# Patient Record
Sex: Male | Born: 1990
Health system: Southern US, Community
[De-identification: ages and names within clinical notes are randomized; demographics above are authoritative.]

## PROBLEM LIST (undated history)

## (undated) DIAGNOSIS — J45909 Unspecified asthma, uncomplicated: Secondary | ICD-10-CM

## (undated) DIAGNOSIS — S21139A Puncture wound without foreign body of unspecified front wall of thorax without penetration into thoracic cavity, initial encounter: Secondary | ICD-10-CM

## (undated) DIAGNOSIS — I213 ST elevation (STEMI) myocardial infarction of unspecified site: Secondary | ICD-10-CM

## (undated) DIAGNOSIS — W3400XA Accidental discharge from unspecified firearms or gun, initial encounter: Secondary | ICD-10-CM

## (undated) DIAGNOSIS — F17210 Nicotine dependence, cigarettes, uncomplicated: Secondary | ICD-10-CM

## (undated) DIAGNOSIS — I255 Ischemic cardiomyopathy: Secondary | ICD-10-CM

## (undated) DIAGNOSIS — I2542 Coronary artery dissection: Secondary | ICD-10-CM

## (undated) DIAGNOSIS — I513 Intracardiac thrombosis, not elsewhere classified: Secondary | ICD-10-CM

## (undated) DIAGNOSIS — I312 Hemopericardium, not elsewhere classified: Secondary | ICD-10-CM

## (undated) DIAGNOSIS — I5042 Chronic combined systolic (congestive) and diastolic (congestive) heart failure: Secondary | ICD-10-CM

## (undated) DIAGNOSIS — I2510 Atherosclerotic heart disease of native coronary artery without angina pectoris: Secondary | ICD-10-CM

## (undated) HISTORY — DX: Ischemic cardiomyopathy: I25.5

## (undated) HISTORY — DX: Atherosclerotic heart disease of native coronary artery without angina pectoris: I25.10

## (undated) HISTORY — DX: Hemopericardium, not elsewhere classified: I31.2

## (undated) HISTORY — DX: Chronic combined systolic (congestive) and diastolic (congestive) heart failure: I50.42

## (undated) HISTORY — DX: Intracardiac thrombosis, not elsewhere classified: I51.3

---
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
## 2013-09-22 ENCOUNTER — Emergency Department (HOSPITAL_COMMUNITY)
Admission: EM | Admit: 2013-09-22 | Discharge: 2013-09-22 | Disposition: A | Discharge status: Home / Self Care | Payer: Self-pay | Attending: Emergency Medicine | Admitting: Emergency Medicine

## 2013-09-22 ENCOUNTER — Encounter (HOSPITAL_COMMUNITY): Payer: Self-pay | Admitting: Emergency Medicine

## 2013-09-22 DIAGNOSIS — R197 Diarrhea, unspecified: Secondary | ICD-10-CM | POA: Insufficient documentation

## 2013-09-22 DIAGNOSIS — R1084 Generalized abdominal pain: Secondary | ICD-10-CM | POA: Insufficient documentation

## 2013-09-22 DIAGNOSIS — F17200 Nicotine dependence, unspecified, uncomplicated: Secondary | ICD-10-CM | POA: Insufficient documentation

## 2013-09-22 LAB — CBC WITH DIFFERENTIAL/PLATELET
Basophils Relative: 0 % (ref 0–1)
Eosinophils Absolute: 0 10*3/uL (ref 0.0–0.7)
Eosinophils Relative: 0 % (ref 0–5)
HCT: 48.4 % (ref 39.0–52.0)
Hemoglobin: 17.6 g/dL — ABNORMAL HIGH (ref 13.0–17.0)
Lymphocytes Relative: 6 % — ABNORMAL LOW (ref 12–46)
Lymphs Abs: 0.8 10*3/uL (ref 0.7–4.0)
MCH: 32.6 pg (ref 26.0–34.0)
MCHC: 36.4 g/dL — ABNORMAL HIGH (ref 30.0–36.0)
MCV: 89.6 fL (ref 78.0–100.0)
Monocytes Absolute: 0.7 10*3/uL (ref 0.1–1.0)
Monocytes Relative: 5 % (ref 3–12)
Neutro Abs: 12.3 10*3/uL — ABNORMAL HIGH (ref 1.7–7.7)
Neutrophils Relative %: 89 % — ABNORMAL HIGH (ref 43–77)
RBC: 5.4 MIL/uL (ref 4.22–5.81)
WBC: 13.8 10*3/uL — ABNORMAL HIGH (ref 4.0–10.5)

## 2013-09-22 LAB — COMPREHENSIVE METABOLIC PANEL
AST: 21 U/L (ref 0–37)
Albumin: 4.5 g/dL (ref 3.5–5.2)
BUN: 9 mg/dL (ref 6–23)
Calcium: 9.7 mg/dL (ref 8.4–10.5)
Chloride: 94 mEq/L — ABNORMAL LOW (ref 96–112)
Creatinine, Ser: 1.42 mg/dL — ABNORMAL HIGH (ref 0.50–1.35)
GFR calc Af Amer: 80 mL/min — ABNORMAL LOW (ref >90)
Glucose, Bld: 102 mg/dL — ABNORMAL HIGH (ref 70–99)
Total Protein: 8.8 g/dL — ABNORMAL HIGH (ref 6.0–8.3)

## 2013-09-22 MED ORDER — DICYCLOMINE HCL 20 MG PO TABS: 20.0000 mg | ORAL_TABLET | Freq: Two times a day (BID) | ORAL | Status: DC

## 2013-09-22 MED ORDER — DICYCLOMINE HCL 10 MG PO CAPS
10.0000 mg | ORAL_CAPSULE | Freq: Once | ORAL | Status: AC
  Administered 2013-09-22: 10 mg via ORAL
  Filled 2013-09-22: qty 1

## 2013-09-22 NOTE — ED Provider Notes (Signed)
CSN: [id number]     Arrival date & time 09/22/13  1130 History   First MD Initiated Contact with Patient 09/22/13 1146     Chief Complaint  Patient presents with  . Diarrhea   (Consider location/radiation/quality/duration/timing/severity/associated sxs/prior Treatment) Patient is a 22 y.o. male presenting with diarrhea. The history is provided by the patient and medical records.  Diarrhea  This is a 22 year old male with no significant past medical history presenting to the ED for diarrhea x3 days. Patient states he has had approximately 10-12 episodes of watery, nonbloody diarrhea since Thursday evening. States he is not really having abdominal "pain" but he does have a generalized discomfort and cramping from having so many bowel movements.  No associated nausea, vomiting, or urinary sx.  Patient states his last solid food intake was a cheeseburger and french fries on Thursday evening.  He notes a subjective fever and chills at home, afebrile on arrival.  Patient states he has been able to tolerate PO gingerale.  No recent sick contacts or recent travel.  No meds taken PTA.  No hx of IBS or crohn's.  History reviewed. No pertinent past medical history. History reviewed. No pertinent past surgical history. No family history on file. History  Substance Use Topics  . Smoking status: Current Every Day Smoker  . Smokeless tobacco: Not on file  . Alcohol Use: Yes    Review of Systems  Gastrointestinal: Positive for diarrhea.  All other systems reviewed and are negative.    Allergies  Review of patient's allergies indicates no known allergies.  Home Medications   Current Outpatient Rx  Name  Route  Sig  Dispense  Refill  . acetaminophen (TYLENOL) 500 MG tablet   Oral   Take 1,000 mg by mouth every 6 (six) hours as needed.          BP 145/96  Pulse 112  Temp(Src) 98.4 F (36.9 C) (Oral)  Resp 16  Wt 197 lb 12.8 oz (89.721 kg)  SpO2 96%  Physical Exam  Nursing note and  vitals reviewed. Constitutional: He is oriented to person, place, and time. He appears well-developed and well-nourished. No distress.  Lying in bed comfortably, texting on cellphone, NAD  HENT:  Head: Normocephalic and atraumatic.  Mouth/Throat: Uvula is midline, oropharynx is clear and moist and mucous membranes are normal.  Eyes: Conjunctivae and EOM are normal. Pupils are equal, round, and reactive to light.  Neck: Normal range of motion. Neck supple.  Cardiovascular: Normal rate, regular rhythm and normal heart sounds.   Pulmonary/Chest: Effort normal and breath sounds normal. No respiratory distress. He has no wheezes.  Abdominal: Soft. Bowel sounds are normal. There is no tenderness. There is no guarding.  Abdomen soft, non-distended, no focal TTP  Musculoskeletal: Normal range of motion.  Neurological: He is alert and oriented to person, place, and time.  Skin: Skin is warm and dry. He is not diaphoretic.  Psychiatric: He has a normal mood and affect.    ED Course  Procedures (including critical care time) Labs Review Labs Reviewed  CBC WITH DIFFERENTIAL - Abnormal; Notable for the following:    WBC 13.8 (*)    Hemoglobin 17.6 (*)    MCHC 36.4 (*)    Neutrophils Relative % 89 (*)    Neutro Abs 12.3 (*)    Lymphocytes Relative 6 (*)    All other components within normal limits  COMPREHENSIVE METABOLIC PANEL - Abnormal; Notable for the following:    Sodium 134 (*)  Chloride 94 (*)    Glucose, Bld 102 (*)    Creatinine, Ser 1.42 (*)    Total Protein 8.8 (*)    GFR calc non Af Amer 69 (*)    GFR calc Af Amer 80 (*)    All other components within normal limits   Imaging Review No results found.  EKG Interpretation   None       MDM   1. Diarrhea    Labs as above, mild leukocytosis.  Pt has tolerated PO in the ED without difficulty.  No episodes of diarrhea in the ED.  At this time i doubt acute/surgical abdomen including, but not limited to, diverticulitis,  SBO, abdominal abscess-- likely gastroenteritis/colitis.  Pt afebrile, non-toxic appearing, NAD, VS stable- ok for discharge.  Instructed to try immodium to help with diarrhea.  Rx bentyl.  FU with [facility] if no improvement in the next few days.  Discussed plan with pt, he agreed.  Return precautions advised.  Garlon Hatchet, PA-C 09/22/13 [phone]

## 2013-09-22 NOTE — Discharge Instructions (Signed)
Take the prescribed medication as directed.  Also advise taking some over the counter imodium to help with diarrhea. Follow-up with the [facility] if no improvement of symptoms within the next few days. Return to the ED for new or worsening symptoms.

## 2013-09-22 NOTE — ED Notes (Signed)
Pt. Stated, i've been having diarrhea since Thursday and having a fever.

## 2013-09-23 NOTE — ED Provider Notes (Signed)
Medical screening examination/treatment/procedure(s) were conducted as a shared visit with non-physician practitioner(s) and myself.  I personally evaluated the patient during the encounter.  EKG Interpretation   None      No blood or pus in stool. Patient is hemodynamically stable. Not dehydrated  Donnetta Hutching, MD 09/23/13 [phone]

## 2015-06-05 ENCOUNTER — Encounter (HOSPITAL_COMMUNITY): Payer: Self-pay | Admitting: Emergency Medicine

## 2015-06-05 ENCOUNTER — Inpatient Hospital Stay (HOSPITAL_COMMUNITY)
Admission: EM | Admit: 2015-06-05 | Discharge: 2015-06-11 | DRG: 907 | Disposition: A | Discharge status: Home Health | Payer: No Typology Code available for payment source | Attending: General Surgery | Admitting: General Surgery

## 2015-06-05 ENCOUNTER — Emergency Department (HOSPITAL_COMMUNITY): Payer: No Typology Code available for payment source

## 2015-06-05 DIAGNOSIS — S21139A Puncture wound without foreign body of unspecified front wall of thorax without penetration into thoracic cavity, initial encounter: Secondary | ICD-10-CM

## 2015-06-05 DIAGNOSIS — S21102A Unspecified open wound of left front wall of thorax without penetration into thoracic cavity, initial encounter: Secondary | ICD-10-CM | POA: Diagnosis present

## 2015-06-05 DIAGNOSIS — I10 Essential (primary) hypertension: Secondary | ICD-10-CM | POA: Diagnosis present

## 2015-06-05 DIAGNOSIS — E876 Hypokalemia: Secondary | ICD-10-CM | POA: Diagnosis not present

## 2015-06-05 DIAGNOSIS — R000 Tachycardia, unspecified: Secondary | ICD-10-CM | POA: Diagnosis present

## 2015-06-05 DIAGNOSIS — I240 Acute coronary thrombosis not resulting in myocardial infarction: Secondary | ICD-10-CM | POA: Diagnosis present

## 2015-06-05 DIAGNOSIS — S31109A Unspecified open wound of abdominal wall, unspecified quadrant without penetration into peritoneal cavity, initial encounter: Secondary | ICD-10-CM | POA: Diagnosis present

## 2015-06-05 DIAGNOSIS — I314 Cardiac tamponade: Secondary | ICD-10-CM

## 2015-06-05 DIAGNOSIS — W3400XA Accidental discharge from unspecified firearms or gun, initial encounter: Secondary | ICD-10-CM

## 2015-06-05 DIAGNOSIS — S2609XA Other injury of heart with hemopericardium, initial encounter: Secondary | ICD-10-CM | POA: Diagnosis present

## 2015-06-05 DIAGNOSIS — I312 Hemopericardium, not elsewhere classified: Secondary | ICD-10-CM

## 2015-06-05 DIAGNOSIS — W3301XA Accidental discharge of shotgun, initial encounter: Secondary | ICD-10-CM

## 2015-06-05 DIAGNOSIS — E1165 Type 2 diabetes mellitus with hyperglycemia: Secondary | ICD-10-CM | POA: Diagnosis present

## 2015-06-05 DIAGNOSIS — Z9689 Presence of other specified functional implants: Secondary | ICD-10-CM

## 2015-06-05 DIAGNOSIS — F419 Anxiety disorder, unspecified: Secondary | ICD-10-CM | POA: Diagnosis present

## 2015-06-05 DIAGNOSIS — S21302A Unspecified open wound of left front wall of thorax with penetration into thoracic cavity, initial encounter: Principal | ICD-10-CM | POA: Diagnosis present

## 2015-06-05 DIAGNOSIS — T794XXA Traumatic shock, initial encounter: Secondary | ICD-10-CM | POA: Diagnosis present

## 2015-06-05 DIAGNOSIS — I959 Hypotension, unspecified: Secondary | ICD-10-CM | POA: Diagnosis present

## 2015-06-05 DIAGNOSIS — S21132A Puncture wound without foreign body of left front wall of thorax without penetration into thoracic cavity, initial encounter: Secondary | ICD-10-CM

## 2015-06-05 DIAGNOSIS — R0600 Dyspnea, unspecified: Secondary | ICD-10-CM | POA: Diagnosis not present

## 2015-06-05 DIAGNOSIS — R410 Disorientation, unspecified: Secondary | ICD-10-CM | POA: Diagnosis present

## 2015-06-05 DIAGNOSIS — D62 Acute posthemorrhagic anemia: Secondary | ICD-10-CM | POA: Diagnosis not present

## 2015-06-05 HISTORY — DX: Nicotine dependence, cigarettes, uncomplicated: F17.210

## 2015-06-05 LAB — CBC
HCT: 41.9 % (ref 39.0–52.0)
Hemoglobin: 14.7 g/dL (ref 13.0–17.0)
MCH: 32.1 pg (ref 26.0–34.0)
MCHC: 35.1 g/dL (ref 30.0–36.0)
MCV: 91.5 fL (ref 78.0–100.0)
Platelets: 288 10*3/uL (ref 150–400)
RBC: 4.58 MIL/uL (ref 4.22–5.81)
RDW: 12.6 % (ref 11.5–15.5)
WBC: 7 10*3/uL (ref 4.0–10.5)

## 2015-06-05 MED ORDER — SODIUM CHLORIDE 0.9 % IV SOLN
INTRAVENOUS | Status: AC | PRN
  Administered 2015-06-05 – 2015-06-06 (×2): 1000 mL via INTRAVENOUS

## 2015-06-05 MED ORDER — ROCURONIUM BROMIDE 50 MG/5ML IV SOLN
INTRAVENOUS | Status: AC | PRN
  Administered 2015-06-05: 100 mg via INTRAVENOUS

## 2015-06-05 MED ORDER — IOHEXOL 300 MG/ML  SOLN
100.0000 mL | Freq: Once | INTRAMUSCULAR | Status: AC | PRN
  Administered 2015-06-05: 100 mL via INTRAVENOUS

## 2015-06-05 MED ORDER — ETOMIDATE 2 MG/ML IV SOLN
INTRAVENOUS | Status: AC | PRN
  Administered 2015-06-05: 20 mg via INTRAVENOUS

## 2015-06-05 NOTE — ED Notes (Signed)
Pt in CT.

## 2015-06-05 NOTE — ED Notes (Signed)
Preparing to intubate.  

## 2015-06-05 NOTE — ED Notes (Signed)
Pt intubated
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
## 2015-06-06 ENCOUNTER — Emergency Department (HOSPITAL_COMMUNITY): Payer: No Typology Code available for payment source

## 2015-06-06 ENCOUNTER — Emergency Department (HOSPITAL_COMMUNITY): Payer: No Typology Code available for payment source | Admitting: Anesthesiology

## 2015-06-06 ENCOUNTER — Encounter (HOSPITAL_COMMUNITY): Payer: Self-pay | Admitting: Radiology

## 2015-06-06 ENCOUNTER — Other Ambulatory Visit: Payer: Self-pay

## 2015-06-06 ENCOUNTER — Inpatient Hospital Stay (HOSPITAL_COMMUNITY): Payer: No Typology Code available for payment source

## 2015-06-06 ENCOUNTER — Encounter (HOSPITAL_COMMUNITY): Admission: EM | Disposition: A | Discharge status: Home Health | Payer: Self-pay | Source: Home / Self Care

## 2015-06-06 DIAGNOSIS — S31109A Unspecified open wound of abdominal wall, unspecified quadrant without penetration into peritoneal cavity, initial encounter: Secondary | ICD-10-CM | POA: Diagnosis present

## 2015-06-06 DIAGNOSIS — E876 Hypokalemia: Secondary | ICD-10-CM | POA: Diagnosis not present

## 2015-06-06 DIAGNOSIS — R000 Tachycardia, unspecified: Secondary | ICD-10-CM | POA: Diagnosis present

## 2015-06-06 DIAGNOSIS — I314 Cardiac tamponade: Secondary | ICD-10-CM | POA: Diagnosis present

## 2015-06-06 DIAGNOSIS — I10 Essential (primary) hypertension: Secondary | ICD-10-CM | POA: Diagnosis present

## 2015-06-06 DIAGNOSIS — S21102A Unspecified open wound of left front wall of thorax without penetration into thoracic cavity, initial encounter: Secondary | ICD-10-CM | POA: Diagnosis present

## 2015-06-06 DIAGNOSIS — W3400XA Accidental discharge from unspecified firearms or gun, initial encounter: Secondary | ICD-10-CM

## 2015-06-06 DIAGNOSIS — E1165 Type 2 diabetes mellitus with hyperglycemia: Secondary | ICD-10-CM | POA: Diagnosis present

## 2015-06-06 DIAGNOSIS — R410 Disorientation, unspecified: Secondary | ICD-10-CM | POA: Diagnosis present

## 2015-06-06 DIAGNOSIS — I959 Hypotension, unspecified: Secondary | ICD-10-CM | POA: Diagnosis present

## 2015-06-06 DIAGNOSIS — W3301XA Accidental discharge of shotgun, initial encounter: Secondary | ICD-10-CM | POA: Diagnosis not present

## 2015-06-06 DIAGNOSIS — S21302A Unspecified open wound of left front wall of thorax with penetration into thoracic cavity, initial encounter: Secondary | ICD-10-CM | POA: Diagnosis present

## 2015-06-06 DIAGNOSIS — F419 Anxiety disorder, unspecified: Secondary | ICD-10-CM | POA: Diagnosis present

## 2015-06-06 DIAGNOSIS — S21139A Puncture wound without foreign body of unspecified front wall of thorax without penetration into thoracic cavity, initial encounter: Secondary | ICD-10-CM

## 2015-06-06 DIAGNOSIS — I240 Acute coronary thrombosis not resulting in myocardial infarction: Secondary | ICD-10-CM | POA: Diagnosis present

## 2015-06-06 DIAGNOSIS — R0600 Dyspnea, unspecified: Secondary | ICD-10-CM | POA: Diagnosis present

## 2015-06-06 DIAGNOSIS — T794XXA Traumatic shock, initial encounter: Secondary | ICD-10-CM | POA: Diagnosis present

## 2015-06-06 DIAGNOSIS — S2609XA Other injury of heart with hemopericardium, initial encounter: Secondary | ICD-10-CM | POA: Diagnosis present

## 2015-06-06 DIAGNOSIS — D62 Acute posthemorrhagic anemia: Secondary | ICD-10-CM | POA: Diagnosis not present

## 2015-06-06 HISTORY — PX: PERICARDIAL FLUID DRAINAGE: SHX5100

## 2015-06-06 HISTORY — PX: CORONARY ARTERY BYPASS GRAFT: SHX141

## 2015-06-06 HISTORY — DX: Puncture wound without foreign body of unspecified front wall of thorax without penetration into thoracic cavity, initial encounter: S21.139A

## 2015-06-06 LAB — PREPARE FRESH FROZEN PLASMA
UNIT DIVISION: 0
Unit division: 0

## 2015-06-06 LAB — I-STAT CHEM 8, ED
BUN: 14 mg/dL (ref 6–20)
CALCIUM ION: 1.11 mmol/L — AB (ref 1.12–1.23)
Chloride: 108 mmol/L (ref 101–111)
Creatinine, Ser: 1.6 mg/dL — ABNORMAL HIGH (ref 0.61–1.24)
Glucose, Bld: 149 mg/dL — ABNORMAL HIGH (ref 65–99)
HCT: 46 % (ref 39.0–52.0)
HEMOGLOBIN: 15.6 g/dL (ref 13.0–17.0)
Potassium: 2.9 mmol/L — ABNORMAL LOW (ref 3.5–5.1)
Sodium: 146 mmol/L — ABNORMAL HIGH (ref 135–145)
TCO2: 19 mmol/L (ref 0–100)

## 2015-06-06 LAB — APTT: APTT: 28 s (ref 24–37)

## 2015-06-06 LAB — POCT I-STAT, CHEM 8
BUN: 10 mg/dL (ref 6–20)
Calcium, Ion: 1 mmol/L — ABNORMAL LOW (ref 1.12–1.23)
Chloride: 112 mmol/L — ABNORMAL HIGH (ref 101–111)
Creatinine, Ser: 1.3 mg/dL — ABNORMAL HIGH (ref 0.61–1.24)
Glucose, Bld: 195 mg/dL — ABNORMAL HIGH (ref 65–99)
HCT: 33 % — ABNORMAL LOW (ref 39.0–52.0)
Hemoglobin: 11.2 g/dL — ABNORMAL LOW (ref 13.0–17.0)
Potassium: 3.9 mmol/L (ref 3.5–5.1)
Sodium: 143 mmol/L (ref 135–145)
TCO2: 14 mmol/L (ref 0–100)

## 2015-06-06 LAB — COMPREHENSIVE METABOLIC PANEL
ALT: 32 U/L (ref 17–63)
ANION GAP: 15 (ref 5–15)
AST: 55 U/L — ABNORMAL HIGH (ref 15–41)
Albumin: 4.4 g/dL (ref 3.5–5.0)
Alkaline Phosphatase: 87 U/L (ref 38–126)
BILIRUBIN TOTAL: 0.5 mg/dL (ref 0.3–1.2)
BUN: 12 mg/dL (ref 6–20)
CALCIUM: 9 mg/dL (ref 8.9–10.3)
CHLORIDE: 110 mmol/L (ref 101–111)
CO2: 20 mmol/L — AB (ref 22–32)
CREATININE: 1.48 mg/dL — AB (ref 0.61–1.24)
GFR calc non Af Amer: >60 mL/min (ref >60)
GLUCOSE: 153 mg/dL — AB (ref 65–99)
Potassium: 2.9 mmol/L — ABNORMAL LOW (ref 3.5–5.1)
Sodium: 145 mmol/L (ref 135–145)
TOTAL PROTEIN: 7 g/dL (ref 6.5–8.1)

## 2015-06-06 LAB — POCT I-STAT 3, ART BLOOD GAS (G3+)
ACID-BASE DEFICIT: 7 mmol/L — AB (ref 0.0–2.0)
ACID-BASE DEFICIT: 3 mmol/L — AB (ref 0.0–2.0)
ACID-BASE DEFICIT: 3 mmol/L — AB (ref 0.0–2.0)
Acid-base deficit: 11 mmol/L — ABNORMAL HIGH (ref 0.0–2.0)
Acid-base deficit: 5 mmol/L — ABNORMAL HIGH (ref 0.0–2.0)
BICARBONATE: 20.9 meq/L (ref 20.0–24.0)
BICARBONATE: 20.8 meq/L (ref 20.0–24.0)
BICARBONATE: 23.3 meq/L (ref 20.0–24.0)
Bicarbonate: 13.8 mEq/L — ABNORMAL LOW (ref 20.0–24.0)
Bicarbonate: 22.9 mEq/L (ref 20.0–24.0)
O2 SAT: 92 %
O2 Saturation: 100 %
O2 Saturation: 93 %
O2 Saturation: 99 %
O2 Saturation: 93 %
PCO2 ART: 46.9 mmHg — AB (ref 35.0–45.0)
PCO2 ART: 43.8 mmHg (ref 35.0–45.0)
PH ART: 7.334 — AB (ref 7.350–7.450)
Patient temperature: 97.1
Patient temperature: 98.5
TCO2: 15 mmol/L (ref 0–100)
TCO2: 22 mmol/L (ref 0–100)
TCO2: 22 mmol/L (ref 0–100)
TCO2: 24 mmol/L (ref 0–100)
TCO2: 25 mmol/L (ref 0–100)
pCO2 arterial: 28.1 mmHg — ABNORMAL LOW (ref 35.0–45.0)
pCO2 arterial: 40.7 mmHg (ref 35.0–45.0)
pCO2 arterial: 41 mmHg (ref 35.0–45.0)
pH, Arterial: 7.3 — ABNORMAL LOW (ref 7.350–7.450)
pH, Arterial: 7.255 — ABNORMAL LOW (ref 7.350–7.450)
pH, Arterial: 7.314 — ABNORMAL LOW (ref 7.350–7.450)
pH, Arterial: 7.35 (ref 7.350–7.450)
pO2, Arterial: 294 mmHg — ABNORMAL HIGH (ref 80.0–100.0)
pO2, Arterial: 77 mmHg — ABNORMAL LOW (ref 80.0–100.0)
pO2, Arterial: 134 mmHg — ABNORMAL HIGH (ref 80.0–100.0)
pO2, Arterial: 65 mmHg — ABNORMAL LOW (ref 80.0–100.0)
pO2, Arterial: 73 mmHg — ABNORMAL LOW (ref 80.0–100.0)

## 2015-06-06 LAB — POCT I-STAT 7, (LYTES, BLD GAS, ICA,H+H)
Acid-base deficit: 12 mmol/L — ABNORMAL HIGH (ref 0.0–2.0)
Bicarbonate: 15.7 mEq/L — ABNORMAL LOW (ref 20.0–24.0)
Calcium, Ion: 1 mmol/L — ABNORMAL LOW (ref 1.12–1.23)
HCT: 41 % (ref 39.0–52.0)
Hemoglobin: 13.9 g/dL (ref 13.0–17.0)
O2 Saturation: 100 %
Patient temperature: 35.6
Potassium: 3.8 mmol/L (ref 3.5–5.1)
Sodium: 144 mmol/L (ref 135–145)
TCO2: 17 mmol/L (ref 0–100)
pCO2 arterial: 37.9 mmHg (ref 35.0–45.0)
pH, Arterial: 7.217 — ABNORMAL LOW (ref 7.350–7.450)
pO2, Arterial: 436 mmHg — ABNORMAL HIGH (ref 80.0–100.0)

## 2015-06-06 LAB — ETHANOL: ALCOHOL ETHYL (B): 292 mg/dL — AB (ref <5)

## 2015-06-06 LAB — BASIC METABOLIC PANEL
Anion gap: 10 (ref 5–15)
BUN: 10 mg/dL (ref 6–20)
CALCIUM: 6.9 mg/dL — AB (ref 8.9–10.3)
CO2: 20 mmol/L — ABNORMAL LOW (ref 22–32)
Chloride: 114 mmol/L — ABNORMAL HIGH (ref 101–111)
Creatinine, Ser: 1.05 mg/dL (ref 0.61–1.24)
GFR calc Af Amer: >60 mL/min (ref >60)
GFR calc non Af Amer: >60 mL/min (ref >60)
Glucose, Bld: 137 mg/dL — ABNORMAL HIGH (ref 65–99)
Potassium: 4.1 mmol/L (ref 3.5–5.1)
SODIUM: 144 mmol/L (ref 135–145)

## 2015-06-06 LAB — POCT I-STAT 4, (NA,K, GLUC, HGB,HCT)
Glucose, Bld: 141 mg/dL — ABNORMAL HIGH (ref 65–99)
HCT: 46 % (ref 39.0–52.0)
Hemoglobin: 15.6 g/dL (ref 13.0–17.0)
Potassium: 4.4 mmol/L (ref 3.5–5.1)
SODIUM: 147 mmol/L — AB (ref 135–145)

## 2015-06-06 LAB — CBC
HCT: 44.9 % (ref 39.0–52.0)
Hemoglobin: 15.5 g/dL (ref 13.0–17.0)
MCH: 31.4 pg (ref 26.0–34.0)
MCHC: 34.5 g/dL (ref 30.0–36.0)
MCV: 90.9 fL (ref 78.0–100.0)
Platelets: 186 10*3/uL (ref 150–400)
RBC: 4.94 MIL/uL (ref 4.22–5.81)
RDW: 13.3 % (ref 11.5–15.5)
WBC: 22.1 10*3/uL — ABNORMAL HIGH (ref 4.0–10.5)

## 2015-06-06 LAB — GLUCOSE, CAPILLARY
GLUCOSE-CAPILLARY: 144 mg/dL — AB (ref 65–99)
Glucose-Capillary: 118 mg/dL — ABNORMAL HIGH (ref 65–99)
Glucose-Capillary: 116 mg/dL — ABNORMAL HIGH (ref 65–99)
Glucose-Capillary: 108 mg/dL — ABNORMAL HIGH (ref 65–99)

## 2015-06-06 LAB — PROTIME-INR
INR: 1.32 (ref 0.00–1.49)
INR: 1.03 (ref 0.00–1.49)
PROTHROMBIN TIME: 13.7 s (ref 11.6–15.2)
Prothrombin Time: 16.5 seconds — ABNORMAL HIGH (ref 11.6–15.2)

## 2015-06-06 LAB — TROPONIN I
TROPONIN I: 18.33 ng/mL — AB (ref <0.031)
Troponin I: 13.57 ng/mL (ref <0.031)
Troponin I: 15.4 ng/mL (ref <0.031)

## 2015-06-06 LAB — PREPARE RBC (CROSSMATCH)

## 2015-06-06 LAB — CDS SEROLOGY

## 2015-06-06 LAB — I-STAT CG4 LACTIC ACID, ED: LACTIC ACID, VENOUS: 5.03 mmol/L — AB (ref 0.5–2.0)

## 2015-06-06 LAB — BLOOD PRODUCT ORDER (VERBAL) VERIFICATION

## 2015-06-06 LAB — MAGNESIUM: Magnesium: 1.5 mg/dL — ABNORMAL LOW (ref 1.7–2.4)

## 2015-06-06 LAB — MRSA PCR SCREENING: MRSA by PCR: NEGATIVE

## 2015-06-06 LAB — ABO/RH: ABO/RH(D): B POS

## 2015-06-06 SURGERY — CORONARY ARTERY BYPASS GRAFTING (CABG)
Anesthesia: General | Site: Chest

## 2015-06-06 MED ORDER — ACETAMINOPHEN 160 MG/5ML PO SOLN: 1000.0000 mg | Freq: Four times a day (QID) | ORAL | Status: DC

## 2015-06-06 MED ORDER — LACTATED RINGERS IV SOLN
INTRAVENOUS | Status: DC | PRN
Start: 2015-06-06 — End: 2015-06-06
  Administered 2015-06-06: 01:00:00 via INTRAVENOUS

## 2015-06-06 MED ORDER — ACETAMINOPHEN 500 MG PO TABS
1000.0000 mg | ORAL_TABLET | Freq: Four times a day (QID) | ORAL | Status: DC
  Administered 2015-06-06 – 2015-06-11 (×11): 1000 mg via ORAL
  Filled 2015-06-06 (×21): qty 2

## 2015-06-06 MED ORDER — EPINEPHRINE HCL 1 MG/ML IJ SOLN
0.0000 ug/min | INTRAVENOUS | Status: DC
  Filled 2015-06-06: qty 4

## 2015-06-06 MED ORDER — ACETAMINOPHEN 650 MG RE SUPP
650.0000 mg | Freq: Once | RECTAL | Status: AC
  Filled 2015-06-06: qty 1

## 2015-06-06 MED ORDER — ROCURONIUM BROMIDE 100 MG/10ML IV SOLN
INTRAVENOUS | Status: DC | PRN
  Administered 2015-06-06 (×3): 50 mg via INTRAVENOUS

## 2015-06-06 MED ORDER — MAGNESIUM SULFATE IN D5W 10-5 MG/ML-% IV SOLN
1.0000 g | Freq: Once | INTRAVENOUS | Status: AC
  Administered 2015-06-06: 1 g via INTRAVENOUS
  Filled 2015-06-06: qty 100

## 2015-06-06 MED ORDER — LACTATED RINGERS IV SOLN: INTRAVENOUS | Status: DC

## 2015-06-06 MED ORDER — PLASMA-LYTE 148 IV SOLN
INTRAVENOUS | Status: AC
  Administered 2015-06-06: 500 mL
  Filled 2015-06-06: qty 2.5

## 2015-06-06 MED ORDER — SODIUM CHLORIDE 0.9 % IJ SOLN
OROMUCOSAL | Status: DC | PRN
  Administered 2015-06-06 (×3): 4 mL via TOPICAL

## 2015-06-06 MED ORDER — OXYCODONE HCL 5 MG PO TABS
5.0000 mg | ORAL_TABLET | ORAL | Status: DC | PRN
  Administered 2015-06-06 – 2015-06-11 (×15): 10 mg via ORAL
  Filled 2015-06-06 (×16): qty 2

## 2015-06-06 MED ORDER — SODIUM CHLORIDE 0.9 % IV SOLN
INTRAVENOUS | Status: DC
  Filled 2015-06-06: qty 30

## 2015-06-06 MED ORDER — DOPAMINE-DEXTROSE 3.2-5 MG/ML-% IV SOLN
0.0000 ug/kg/min | INTRAVENOUS | Status: DC
  Filled 2015-06-06: qty 250

## 2015-06-06 MED ORDER — FAMOTIDINE IN NACL 20-0.9 MG/50ML-% IV SOLN
20.0000 mg | Freq: Two times a day (BID) | INTRAVENOUS | Status: AC
  Administered 2015-06-06 (×2): 20 mg via INTRAVENOUS
  Filled 2015-06-06 (×2): qty 50

## 2015-06-06 MED ORDER — MORPHINE SULFATE 2 MG/ML IJ SOLN
2.0000 mg | INTRAMUSCULAR | Status: DC | PRN
  Administered 2015-06-06: 2 mg via INTRAVENOUS
  Administered 2015-06-06: 4 mg via INTRAVENOUS
  Administered 2015-06-06 (×3): 2 mg via INTRAVENOUS
  Administered 2015-06-07: 4 mg via INTRAVENOUS
  Administered 2015-06-07: 2 mg via INTRAVENOUS
  Administered 2015-06-07: 5 mg via INTRAVENOUS
  Administered 2015-06-08 – 2015-06-10 (×5): 2 mg via INTRAVENOUS
  Filled 2015-06-06: qty 2
  Filled 2015-06-06: qty 3
  Filled 2015-06-06: qty 2
  Filled 2015-06-06 (×4): qty 1
  Filled 2015-06-06 (×2): qty 2
  Filled 2015-06-06: qty 1
  Filled 2015-06-06: qty 2

## 2015-06-06 MED ORDER — ARTIFICIAL TEARS OP OINT
TOPICAL_OINTMENT | OPHTHALMIC | Status: DC | PRN
  Administered 2015-06-06: 1 via OPHTHALMIC

## 2015-06-06 MED ORDER — SODIUM CHLORIDE 0.9 % IV SOLN: INTRAVENOUS | Status: DC

## 2015-06-06 MED ORDER — PHENYLEPHRINE HCL 10 MG/ML IJ SOLN
INTRAMUSCULAR | Status: DC | PRN
  Administered 2015-06-06 (×2): 160 ug via INTRAVENOUS
  Administered 2015-06-06: 120 ug via INTRAVENOUS
  Administered 2015-06-06: 160 ug via INTRAVENOUS

## 2015-06-06 MED ORDER — POTASSIUM CHLORIDE 10 MEQ/50ML IV SOLN: 10.0000 meq | INTRAVENOUS | Status: AC

## 2015-06-06 MED ORDER — SODIUM CHLORIDE 0.9 % IV SOLN
INTRAVENOUS | Status: DC
  Filled 2015-06-06: qty 40

## 2015-06-06 MED ORDER — SODIUM CHLORIDE 0.9 % IV SOLN
10.0000 mL/h | Freq: Once | INTRAVENOUS | Status: AC
  Administered 2015-06-06 (×2): via INTRAVENOUS

## 2015-06-06 MED ORDER — MIDAZOLAM HCL 10 MG/2ML IJ SOLN
INTRAMUSCULAR | Status: AC
  Filled 2015-06-06: qty 4

## 2015-06-06 MED ORDER — DOCUSATE SODIUM 100 MG PO CAPS
200.0000 mg | ORAL_CAPSULE | Freq: Every day | ORAL | Status: DC
  Administered 2015-06-07 – 2015-06-11 (×5): 200 mg via ORAL
  Filled 2015-06-06 (×5): qty 2

## 2015-06-06 MED ORDER — DEXTROSE 5 % IV SOLN
0.0000 ug/min | INTRAVENOUS | Status: DC
  Filled 2015-06-06: qty 2

## 2015-06-06 MED ORDER — MIDAZOLAM HCL 2 MG/2ML IJ SOLN
INTRAMUSCULAR | Status: DC | PRN
  Administered 2015-06-06 (×2): 5 mg via INTRAVENOUS
  Administered 2015-06-06: 2 mg via INTRAVENOUS

## 2015-06-06 MED ORDER — DEXTROSE 5 % IV SOLN
1.5000 g | Freq: Two times a day (BID) | INTRAVENOUS | Status: AC
  Administered 2015-06-06 – 2015-06-07 (×4): 1.5 g via INTRAVENOUS
  Filled 2015-06-06 (×4): qty 1.5

## 2015-06-06 MED ORDER — SODIUM BICARBONATE 8.4 % IV SOLN
INTRAVENOUS | Status: AC
  Filled 2015-06-06: qty 50

## 2015-06-06 MED ORDER — VANCOMYCIN HCL 10 G IV SOLR
1250.0000 mg | INTRAVENOUS | Status: DC
  Filled 2015-06-06: qty 1250

## 2015-06-06 MED ORDER — MIDAZOLAM HCL 2 MG/2ML IJ SOLN
INTRAMUSCULAR | Status: AC
  Administered 2015-06-06: 2 mg
  Filled 2015-06-06: qty 2

## 2015-06-06 MED ORDER — ASPIRIN EC 325 MG PO TBEC
325.0000 mg | DELAYED_RELEASE_TABLET | Freq: Every day | ORAL | Status: DC
  Administered 2015-06-07 – 2015-06-10 (×4): 325 mg via ORAL
  Filled 2015-06-06 (×5): qty 1

## 2015-06-06 MED ORDER — FENTANYL CITRATE (PF) 250 MCG/5ML IJ SOLN
INTRAMUSCULAR | Status: AC
  Filled 2015-06-06: qty 5

## 2015-06-06 MED ORDER — ONDANSETRON HCL 4 MG/2ML IJ SOLN
4.0000 mg | Freq: Four times a day (QID) | INTRAMUSCULAR | Status: DC | PRN
  Administered 2015-06-06 – 2015-06-08 (×3): 4 mg via INTRAVENOUS
  Filled 2015-06-06 (×3): qty 2

## 2015-06-06 MED ORDER — DEXTROSE 5 % IV SOLN
750.0000 mg | INTRAVENOUS | Status: DC
  Filled 2015-06-06: qty 750

## 2015-06-06 MED ORDER — ACETAMINOPHEN 160 MG/5ML PO SOLN
650.0000 mg | Freq: Once | ORAL | Status: AC
  Administered 2015-06-06: 650 mg
  Filled 2015-06-06: qty 20.3

## 2015-06-06 MED ORDER — MAGNESIUM SULFATE 4 GM/100ML IV SOLN: 4.0000 g | Freq: Once | INTRAVENOUS | Status: DC

## 2015-06-06 MED ORDER — METOPROLOL TARTRATE 25 MG/10 ML ORAL SUSPENSION
12.5000 mg | Freq: Two times a day (BID) | ORAL | Status: DC
  Administered 2015-06-06: 12.5 mg
  Filled 2015-06-06 (×6): qty 5

## 2015-06-06 MED ORDER — CHLORHEXIDINE GLUCONATE 0.12% ORAL RINSE (MEDLINE KIT)
15.0000 mL | Freq: Two times a day (BID) | OROMUCOSAL | Status: DC
  Administered 2015-06-06 – 2015-06-07 (×3): 15 mL via OROMUCOSAL

## 2015-06-06 MED ORDER — MIDAZOLAM HCL 2 MG/2ML IJ SOLN
INTRAMUSCULAR | Status: AC
  Filled 2015-06-06: qty 4

## 2015-06-06 MED ORDER — ROCURONIUM BROMIDE 50 MG/5ML IV SOLN
INTRAVENOUS | Status: AC
  Filled 2015-06-06: qty 2

## 2015-06-06 MED ORDER — 0.9 % SODIUM CHLORIDE (POUR BTL) OPTIME
TOPICAL | Status: DC | PRN
  Administered 2015-06-06: 6000 mL

## 2015-06-06 MED ORDER — MAGNESIUM SULFATE 2 GM/50ML IV SOLN: 2.0000 g | Freq: Once | INTRAVENOUS | Status: DC

## 2015-06-06 MED ORDER — VANCOMYCIN HCL IN DEXTROSE 1-5 GM/200ML-% IV SOLN
1000.0000 mg | Freq: Once | INTRAVENOUS | Status: AC
  Administered 2015-06-06: 1000 mg via INTRAVENOUS
  Filled 2015-06-06: qty 200

## 2015-06-06 MED ORDER — METOPROLOL TARTRATE 12.5 MG HALF TABLET
12.5000 mg | ORAL_TABLET | Freq: Two times a day (BID) | ORAL | Status: DC
  Administered 2015-06-06 – 2015-06-08 (×4): 12.5 mg via ORAL
  Filled 2015-06-06 (×6): qty 1

## 2015-06-06 MED ORDER — SODIUM CHLORIDE 0.9 % IV SOLN
INTRAVENOUS | Status: DC
  Filled 2015-06-06: qty 2.5

## 2015-06-06 MED ORDER — FENTANYL CITRATE (PF) 100 MCG/2ML IJ SOLN
INTRAMUSCULAR | Status: DC | PRN
  Administered 2015-06-06 (×3): 250 ug via INTRAVENOUS

## 2015-06-06 MED ORDER — BISACODYL 5 MG PO TBEC
10.0000 mg | DELAYED_RELEASE_TABLET | Freq: Every day | ORAL | Status: DC
  Administered 2015-06-07 – 2015-06-11 (×5): 10 mg via ORAL
  Filled 2015-06-06 (×5): qty 2

## 2015-06-06 MED ORDER — SODIUM BICARBONATE 4.2 % IV SOLN
INTRAVENOUS | Status: DC | PRN
  Administered 2015-06-06: 150 meq via INTRAVENOUS

## 2015-06-06 MED ORDER — VANCOMYCIN HCL 10 G IV SOLR
1500.0000 mg | Freq: Once | INTRAVENOUS | Status: AC
  Administered 2015-06-06: 1500 mg via INTRAVENOUS
  Filled 2015-06-06: qty 1500

## 2015-06-06 MED ORDER — METOPROLOL TARTRATE 1 MG/ML IV SOLN: 2.5000 mg | INTRAVENOUS | Status: DC | PRN

## 2015-06-06 MED ORDER — ALBUMIN HUMAN 5 % IV SOLN: 250.0000 mL | INTRAVENOUS | Status: AC | PRN

## 2015-06-06 MED ORDER — FENTANYL CITRATE (PF) 100 MCG/2ML IJ SOLN
INTRAMUSCULAR | Status: AC
  Filled 2015-06-06: qty 2

## 2015-06-06 MED ORDER — SODIUM CHLORIDE 0.9 % IV SOLN
500.0000 mL | Freq: Once | INTRAVENOUS | Status: AC
  Administered 2015-06-06: 500 mL via INTRAVENOUS

## 2015-06-06 MED ORDER — ANTISEPTIC ORAL RINSE SOLUTION (CORINZ)
7.0000 mL | Freq: Four times a day (QID) | OROMUCOSAL | Status: DC
  Administered 2015-06-07 – 2015-06-08 (×4): 7 mL via OROMUCOSAL

## 2015-06-06 MED ORDER — SODIUM BICARBONATE 8.4 % IV SOLN
50.0000 meq | Freq: Once | INTRAVENOUS | Status: AC
  Administered 2015-06-06: 50 meq via INTRAVENOUS
  Filled 2015-06-06: qty 50

## 2015-06-06 MED ORDER — SODIUM CHLORIDE 0.9 % IJ SOLN
3.0000 mL | INTRAMUSCULAR | Status: DC | PRN
Start: 2015-06-07 — End: 2015-06-11

## 2015-06-06 MED ORDER — FENTANYL CITRATE (PF) 100 MCG/2ML IJ SOLN
INTRAMUSCULAR | Status: AC | PRN
  Administered 2015-06-06: 100 ug via INTRAVENOUS

## 2015-06-06 MED ORDER — PHENYLEPHRINE HCL 10 MG/ML IJ SOLN
30.0000 ug/min | INTRAVENOUS | Status: AC
  Administered 2015-06-06: 40 ug/min via INTRAVENOUS
  Filled 2015-06-06: qty 2

## 2015-06-06 MED ORDER — SODIUM CHLORIDE 0.9 % IV SOLN
INTRAVENOUS | Status: DC | PRN
  Administered 2015-06-06: 1000 mL via INTRAVENOUS

## 2015-06-06 MED ORDER — POTASSIUM CHLORIDE 2 MEQ/ML IV SOLN
80.0000 meq | INTRAVENOUS | Status: DC
  Filled 2015-06-06: qty 40

## 2015-06-06 MED ORDER — DEXMEDETOMIDINE HCL IN NACL 200 MCG/50ML IV SOLN
0.0000 ug/kg/h | INTRAVENOUS | Status: DC
  Administered 2015-06-06 (×2): 0.7 ug/kg/h via INTRAVENOUS
  Filled 2015-06-06 (×2): qty 50

## 2015-06-06 MED ORDER — MIDAZOLAM HCL 2 MG/2ML IJ SOLN: 2.0000 mg | INTRAMUSCULAR | Status: DC | PRN

## 2015-06-06 MED ORDER — MAGNESIUM SULFATE 50 % IJ SOLN
40.0000 meq | INTRAMUSCULAR | Status: DC
  Filled 2015-06-06: qty 10

## 2015-06-06 MED ORDER — MORPHINE SULFATE 2 MG/ML IJ SOLN
1.0000 mg | INTRAMUSCULAR | Status: AC | PRN
  Administered 2015-06-06 (×3): 4 mg via INTRAVENOUS
  Filled 2015-06-06 (×3): qty 2

## 2015-06-06 MED ORDER — LACTATED RINGERS IV SOLN: 500.0000 mL | Freq: Once | INTRAVENOUS | Status: AC | PRN

## 2015-06-06 MED ORDER — NITROGLYCERIN IN D5W 200-5 MCG/ML-% IV SOLN: 0.0000 ug/min | INTRAVENOUS | Status: DC

## 2015-06-06 MED ORDER — NITROGLYCERIN IN D5W 200-5 MCG/ML-% IV SOLN
2.0000 ug/min | INTRAVENOUS | Status: DC
  Filled 2015-06-06: qty 250

## 2015-06-06 MED ORDER — BISACODYL 10 MG RE SUPP
10.0000 mg | Freq: Every day | RECTAL | Status: DC
  Filled 2015-06-06: qty 1

## 2015-06-06 MED ORDER — SODIUM CHLORIDE 0.9 % IV SOLN: 250.0000 mL | INTRAVENOUS | Status: DC

## 2015-06-06 MED ORDER — PANTOPRAZOLE SODIUM 40 MG PO TBEC
40.0000 mg | DELAYED_RELEASE_TABLET | Freq: Every day | ORAL | Status: DC
Start: 2015-06-08 — End: 2015-06-11
  Administered 2015-06-08 – 2015-06-11 (×4): 40 mg via ORAL
  Filled 2015-06-06 (×4): qty 1

## 2015-06-06 MED ORDER — DEXMEDETOMIDINE HCL IN NACL 400 MCG/100ML IV SOLN
0.1000 ug/kg/h | INTRAVENOUS | Status: AC
  Administered 2015-06-06: .4 ug/kg/h via INTRAVENOUS
  Filled 2015-06-06: qty 100

## 2015-06-06 MED ORDER — DEXTROSE 5 % IV SOLN
1.5000 g | INTRAVENOUS | Status: AC
  Administered 2015-06-06: 1.5 g via INTRAVENOUS
  Filled 2015-06-06: qty 1.5

## 2015-06-06 MED ORDER — SODIUM CHLORIDE 0.9 % IJ SOLN
3.0000 mL | Freq: Two times a day (BID) | INTRAMUSCULAR | Status: DC
  Administered 2015-06-07 – 2015-06-11 (×8): 3 mL via INTRAVENOUS

## 2015-06-06 MED ORDER — INSULIN REGULAR BOLUS VIA INFUSION
0.0000 [IU] | Freq: Three times a day (TID) | INTRAVENOUS | Status: DC
  Filled 2015-06-06: qty 10

## 2015-06-06 MED ORDER — TRAMADOL HCL 50 MG PO TABS
50.0000 mg | ORAL_TABLET | ORAL | Status: DC | PRN
  Administered 2015-06-06: 50 mg via ORAL
  Filled 2015-06-06: qty 1

## 2015-06-06 MED ORDER — ASPIRIN 81 MG PO CHEW
324.0000 mg | CHEWABLE_TABLET | Freq: Every day | ORAL | Status: DC
  Administered 2015-06-11: 324 mg
  Filled 2015-06-06: qty 4

## 2015-06-06 MED ORDER — SODIUM CHLORIDE 0.45 % IV SOLN
INTRAVENOUS | Status: DC | PRN
  Administered 2015-06-06: 08:00:00 via INTRAVENOUS

## 2015-06-06 SURGICAL SUPPLY — 87 items
BAG DECANTER FOR FLEXI CONT (MISCELLANEOUS) ×2 IMPLANT
BANDAGE ELASTIC 4 VELCRO ST LF (GAUZE/BANDAGES/DRESSINGS) ×2 IMPLANT
BANDAGE ELASTIC 6 VELCRO ST LF (GAUZE/BANDAGES/DRESSINGS) ×2 IMPLANT
BLADE STERNUM SYSTEM 6 (BLADE) ×2 IMPLANT
BNDG GAUZE ELAST 4 BULKY (GAUZE/BANDAGES/DRESSINGS) ×2 IMPLANT
CANISTER SUCTION 2500CC (MISCELLANEOUS) ×2 IMPLANT
CATH CPB KIT GERHARDT (MISCELLANEOUS) ×2 IMPLANT
CATH THORACIC 28FR (CATHETERS) ×2 IMPLANT
CONN ST 1/4X3/8  BEN (MISCELLANEOUS) ×2
CONN ST 1/4X3/8 BEN (MISCELLANEOUS) IMPLANT
CONNECTOR 5 IN 1 STRAIGHT STRL (MISCELLANEOUS) ×1 IMPLANT
CONT SPEC 4OZ CLIKSEAL STRL BL (MISCELLANEOUS) ×2 IMPLANT
CRADLE DONUT ADULT HEAD (MISCELLANEOUS) ×2 IMPLANT
DRAIN CHANNEL 28F RND 3/8 FF (WOUND CARE) ×3 IMPLANT
DRAPE CARDIOVASCULAR INCISE (DRAPES) ×2
DRAPE SLUSH/WARMER DISC (DRAPES) ×2 IMPLANT
DRAPE SRG 135X102X78XABS (DRAPES) ×1 IMPLANT
DRSG AQUACEL AG ADV 3.5X14 (GAUZE/BANDAGES/DRESSINGS) ×2 IMPLANT
DRSG PAD ABDOMINAL 8X10 ST (GAUZE/BANDAGES/DRESSINGS) ×1 IMPLANT
ELECT BLADE 4.0 EZ CLEAN MEGAD (MISCELLANEOUS) ×2
ELECT REM PT RETURN 9FT ADLT (ELECTROSURGICAL) ×4
ELECTRODE BLDE 4.0 EZ CLN MEGD (MISCELLANEOUS) ×1 IMPLANT
ELECTRODE REM PT RTRN 9FT ADLT (ELECTROSURGICAL) ×2 IMPLANT
GAUZE SPONGE 4X4 12PLY STRL (GAUZE/BANDAGES/DRESSINGS) ×4 IMPLANT
GLOVE BIO SURGEON STRL SZ 6.5 (GLOVE) ×6 IMPLANT
GLOVE BIOGEL PI IND STRL 6 (GLOVE) IMPLANT
GLOVE BIOGEL PI IND STRL 6.5 (GLOVE) IMPLANT
GLOVE BIOGEL PI IND STRL 7.5 (GLOVE) IMPLANT
GLOVE BIOGEL PI INDICATOR 6 (GLOVE) ×1
GLOVE BIOGEL PI INDICATOR 6.5 (GLOVE) ×4
GLOVE BIOGEL PI INDICATOR 7.5 (GLOVE) ×1
GOWN STRL REUS W/ TWL LRG LVL3 (GOWN DISPOSABLE) ×4 IMPLANT
GOWN STRL REUS W/TWL LRG LVL3 (GOWN DISPOSABLE) ×8
HANDPIECE INTERPULSE COAX TIP (DISPOSABLE) ×2
HEMOSTAT POWDER SURGIFOAM 1G (HEMOSTASIS) ×6 IMPLANT
HEMOSTAT SURGICEL 2X14 (HEMOSTASIS) ×2 IMPLANT
KIT BASIN OR (CUSTOM PROCEDURE TRAY) ×2 IMPLANT
KIT CATH SUCT 8FR (CATHETERS) ×2 IMPLANT
KIT ROOM TURNOVER OR (KITS) ×2 IMPLANT
KIT SUCTION CATH 14FR (SUCTIONS) ×4 IMPLANT
KIT VASOVIEW W/TROCAR VH 2000 (KITS) ×2 IMPLANT
LEAD PACING MYOCARDI (MISCELLANEOUS) ×2 IMPLANT
MARKER GRAFT CORONARY BYPASS (MISCELLANEOUS) ×6 IMPLANT
NS IRRIG 1000ML POUR BTL (IV SOLUTION) ×10 IMPLANT
PACK OPEN HEART (CUSTOM PROCEDURE TRAY) ×2 IMPLANT
PAD ARMBOARD 7.5X6 YLW CONV (MISCELLANEOUS) ×4 IMPLANT
PAD ELECT DEFIB RADIOL ZOLL (MISCELLANEOUS) ×2 IMPLANT
PENCIL BUTTON HOLSTER BLD 10FT (ELECTRODE) ×2 IMPLANT
SET HNDPC FAN SPRY TIP SCT (DISPOSABLE) IMPLANT
SUT BONE WAX W31G (SUTURE) ×2 IMPLANT
SUT ETHIBOND 2 0 SH (SUTURE) ×8
SUT ETHIBOND 2 0 SH 36X2 (SUTURE) IMPLANT
SUT PROLENE 3 0 SH1 36 (SUTURE) ×2 IMPLANT
SUT PROLENE 4 0 RB 1 (SUTURE) ×2
SUT PROLENE 4 0 TF (SUTURE) ×4 IMPLANT
SUT PROLENE 4-0 RB1 .5 CRCL 36 (SUTURE) IMPLANT
SUT PROLENE 5 0 C 1 36 (SUTURE) ×2 IMPLANT
SUT PROLENE 6 0 C 1 30 (SUTURE) ×2 IMPLANT
SUT PROLENE 6 0 CC (SUTURE) ×4 IMPLANT
SUT PROLENE 7 0 BV1 MDA (SUTURE) ×2 IMPLANT
SUT PROLENE 8 0 BV175 6 (SUTURE) ×2 IMPLANT
SUT SILK  1 MH (SUTURE) ×3
SUT SILK 1 MH (SUTURE) IMPLANT
SUT SILK 1 TIES 10X30 (SUTURE) ×1 IMPLANT
SUT SILK 2 0 SH CR/8 (SUTURE) ×2 IMPLANT
SUT SILK 2 0 TIES 10X30 (SUTURE) ×1 IMPLANT
SUT SILK 2 0 TIES 17X18 (SUTURE) ×2
SUT SILK 2-0 18XBRD TIE BLK (SUTURE) IMPLANT
SUT SILK 3 0 SH CR/8 (SUTURE) ×1 IMPLANT
SUT SILK 4 0 TIE 10X30 (SUTURE) ×2 IMPLANT
SUT STEEL 6MS V (SUTURE) ×2 IMPLANT
SUT STEEL SZ 6 DBL 3X14 BALL (SUTURE) ×2 IMPLANT
SUT TEM PAC WIRE 2 0 SH (SUTURE) ×4 IMPLANT
SUT VIC AB 1 CTX 18 (SUTURE) ×4 IMPLANT
SUT VIC AB 2-0 CTX 27 (SUTURE) ×3 IMPLANT
SUT VIC AB 3-0 X1 27 (SUTURE) ×2 IMPLANT
SUTURE E-PAK OPEN HEART (SUTURE) ×2 IMPLANT
SYSTEM SAHARA CHEST DRAIN ATS (WOUND CARE) ×2 IMPLANT
TAPE CLOTH SURG 4X10 WHT LF (GAUZE/BANDAGES/DRESSINGS) ×1 IMPLANT
TOWEL OR 17X24 6PK STRL BLUE (TOWEL DISPOSABLE) ×4 IMPLANT
TOWEL OR 17X26 10 PK STRL BLUE (TOWEL DISPOSABLE) ×4 IMPLANT
TRAY FOLEY IC TEMP SENS 16FR (CATHETERS) ×2 IMPLANT
TUBE CONNECTING 12X1/4 (SUCTIONS) ×3 IMPLANT
TUBING INSUFFLATION (TUBING) ×2 IMPLANT
UNDERPAD 30X30 INCONTINENT (UNDERPADS AND DIAPERS) ×2 IMPLANT
WATER STERILE IRR 1000ML POUR (IV SOLUTION) ×4 IMPLANT
YANKAUER SUCT BULB TIP NO VENT (SUCTIONS) ×1 IMPLANT
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
## 2015-06-06 NOTE — ED Notes (Signed)
First emergency released blood started just prior to transport to OR

## 2015-06-06 NOTE — ED Notes (Signed)
Pt taken to the OR accompanied by Eme, RN, Epimenio Foot MD, and respiratory.

## 2015-06-06 NOTE — Progress Notes (Signed)
Chaplain responded to page from ED. According to report [employer] advised by medical team in the OR. Chaplain located family in 2S waiting; report they were updated by Dr. Janee Morn. Chaplain moved pt family to [facility] waiting according to information from [employer]. OR Nurse was able to inform family more. Prayer had with family upon pt mother request Jiles Harold 06/06/2015

## 2015-06-06 NOTE — Procedures (Signed)
Extubation Procedure Note  Patient Details:   Name: [name] DOB: January 18, 1991 MRN: [id number]   Airway Documentation:     Evaluation  O2 sats: stable throughout Complications: No apparent complications Patient did tolerate procedure well. Bilateral Breath Sounds: Clear, Diminished Suctioning: Airway Yes  Prior to extubation: NIF - 30, VC 1.16, ABG within parameters. Post extubation: Patient able to speak, bilateral BS, vitals stable, placed on [place] 4 lpm with humidity.   [name] 06/06/2015, 10:55 AM
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
## 2015-06-06 NOTE — Anesthesia Preprocedure Evaluation (Addendum)
Anesthesia Evaluation  Patient identified by MRN, date of birth, ID band Patient unresponsive  General Assessment Comment:Sedated and intubatedPreop documentation limited or incomplete due to emergent nature of procedure.  Airway Mallampati: Intubated      Comment: ETT in situ Dental   Pulmonary    + decreased breath sounds      Cardiovascular Rhythm:Regular Rate:Tachycardia     Neuro/Psych    GI/Hepatic   Endo/Other    Renal/GU      Musculoskeletal   Abdominal   Peds  Hematology   Anesthesia Other Findings   Reproductive/Obstetrics                            Anesthesia Physical Anesthesia Plan  ASA: IV and emergent  Anesthesia Plan: General   Post-op Pain Management:    Induction: Inhalational and Intravenous  Airway Management Planned: Oral ETT  Additional Equipment: Arterial line, CVP, Ultrasound Guidance Line Placement and 3D TEE  Intra-op Plan:   Post-operative Plan: Post-operative intubation/ventilation  Informed Consent: I have reviewed the patients History and Physical, chart, labs and discussed the procedure including the risks, benefits and alternatives for the proposed anesthesia with the patient or authorized representative who has indicated his/her understanding and acceptance.   Only emergency history available  Plan Discussed with: Anesthesiologist and Surgeon  Anesthesia Plan Comments:         Anesthesia Quick Evaluation

## 2015-06-06 NOTE — Transfer of Care (Signed)
Immediate Anesthesia Transfer of Care Note  Patient: [name]  Procedure(s) Performed: Procedure(s): Exploratory Sternotomy, Drainage of Pericardial Effusion, Debridement of Left Chest Wound, Evacuation of Hematoma (N/A)  Patient Location: SICU  Anesthesia Type:General  Level of Consciousness: sedated, unresponsive and Patient remains intubated per anesthesia plan  Airway & Oxygen Therapy: Patient remains intubated per anesthesia plan and Patient placed on Ventilator (see vital sign flow sheet for setting)  Post-op Assessment: Report given to RN and Post -op Vital signs reviewed and stable  Post vital signs: Reviewed and stable  Last Vitals:  Filed Vitals:   06/06/15 0028  BP: 74/53  Pulse:   Temp:   Resp: 16    Complications: No apparent anesthesia complications
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
## 2015-06-07 ENCOUNTER — Inpatient Hospital Stay (HOSPITAL_COMMUNITY): Payer: No Typology Code available for payment source

## 2015-06-07 LAB — BASIC METABOLIC PANEL
Anion gap: 6 (ref 5–15)
BUN: 7 mg/dL (ref 6–20)
CO2: 27 mmol/L (ref 22–32)
Calcium: 7.6 mg/dL — ABNORMAL LOW (ref 8.9–10.3)
Chloride: 104 mmol/L (ref 101–111)
Creatinine, Ser: 0.93 mg/dL (ref 0.61–1.24)
GFR calc Af Amer: >60 mL/min (ref >60)
GFR calc non Af Amer: >60 mL/min (ref >60)
Glucose, Bld: 111 mg/dL — ABNORMAL HIGH (ref 65–99)
Potassium: 3.5 mmol/L (ref 3.5–5.1)
Sodium: 137 mmol/L (ref 135–145)

## 2015-06-07 LAB — POCT I-STAT, CHEM 8
BUN: 6 mg/dL (ref 6–20)
CHLORIDE: 95 mmol/L — AB (ref 101–111)
CREATININE: 0.9 mg/dL (ref 0.61–1.24)
Calcium, Ion: 1.2 mmol/L (ref 1.12–1.23)
Glucose, Bld: 106 mg/dL — ABNORMAL HIGH (ref 65–99)
HEMATOCRIT: 48 % (ref 39.0–52.0)
Hemoglobin: 16.3 g/dL (ref 13.0–17.0)
POTASSIUM: 3.5 mmol/L (ref 3.5–5.1)
SODIUM: 135 mmol/L (ref 135–145)
TCO2: 26 mmol/L (ref 0–100)

## 2015-06-07 LAB — CBC
HCT: 42.7 % (ref 39.0–52.0)
HEMATOCRIT: 42.6 % (ref 39.0–52.0)
Hemoglobin: 14.8 g/dL (ref 13.0–17.0)
Hemoglobin: 15.1 g/dL (ref 13.0–17.0)
MCH: 31.4 pg (ref 26.0–34.0)
MCH: 32.1 pg (ref 26.0–34.0)
MCHC: 34.7 g/dL (ref 30.0–36.0)
MCHC: 35.4 g/dL (ref 30.0–36.0)
MCV: 90.7 fL (ref 78.0–100.0)
MCV: 90.6 fL (ref 78.0–100.0)
PLATELETS: 195 10*3/uL (ref 150–400)
Platelets: 212 10*3/uL (ref 150–400)
RBC: 4.71 MIL/uL (ref 4.22–5.81)
RBC: 4.7 MIL/uL (ref 4.22–5.81)
RDW: 14.4 % (ref 11.5–15.5)
RDW: 14 % (ref 11.5–15.5)
WBC: 20.2 10*3/uL — ABNORMAL HIGH (ref 4.0–10.5)
WBC: 17.5 10*3/uL — AB (ref 4.0–10.5)

## 2015-06-07 LAB — MAGNESIUM: MAGNESIUM: 1.7 mg/dL (ref 1.7–2.4)

## 2015-06-07 LAB — CREATININE, SERUM
Creatinine, Ser: 1.02 mg/dL (ref 0.61–1.24)
GFR calc Af Amer: >60 mL/min (ref >60)

## 2015-06-07 LAB — GLUCOSE, CAPILLARY
Glucose-Capillary: 95 mg/dL (ref 65–99)
Glucose-Capillary: 112 mg/dL — ABNORMAL HIGH (ref 65–99)

## 2015-06-07 MED ORDER — CLONAZEPAM 0.5 MG PO TBDP
1.0000 mg | ORAL_TABLET | Freq: Two times a day (BID) | ORAL | Status: DC
  Administered 2015-06-07 – 2015-06-10 (×8): 1 mg via ORAL
  Filled 2015-06-07 (×8): qty 2

## 2015-06-07 MED ORDER — DIPHENHYDRAMINE HCL 50 MG/ML IJ SOLN
INTRAMUSCULAR | Status: AC
  Administered 2015-06-07: 25 mg via INTRAVENOUS
  Filled 2015-06-07: qty 1

## 2015-06-07 MED ORDER — HYDROMORPHONE HCL 1 MG/ML IJ SOLN
0.5000 mg | INTRAMUSCULAR | Status: DC | PRN
  Administered 2015-06-07 – 2015-06-08 (×3): 1 mg via INTRAVENOUS
  Filled 2015-06-07 (×2): qty 1
  Filled 2015-06-07: qty 2

## 2015-06-07 MED ORDER — DIPHENHYDRAMINE HCL 50 MG/ML IJ SOLN
25.0000 mg | Freq: Three times a day (TID) | INTRAMUSCULAR | Status: DC | PRN
  Administered 2015-06-07 – 2015-06-08 (×2): 25 mg via INTRAVENOUS
  Filled 2015-06-07: qty 1
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
## 2015-06-07 NOTE — Anesthesia Postprocedure Evaluation (Signed)
  Anesthesia Post-op Note  Patient: [name]  Procedure(s) Performed: Procedure(s): Exploratory Sternotomy, Drainage of Pericardial Effusion, Debridement of Left Chest Wound, Evacuation of Hematoma (N/A)  Patient Location: SICU  Anesthesia Type:General  Level of Consciousness: sedated and Patient remains intubated per anesthesia plan  Airway and Oxygen Therapy: Patient remains intubated per anesthesia plan and Patient placed on Ventilator (see vital sign flow sheet for setting)  Post-op Pain: none  Post-op Assessment: Post-op Vital signs reviewed, Patient's Cardiovascular Status Stable and Respiratory Function Stable              Post-op Vital Signs: stable  Last Vitals:  Filed Vitals:   06/07/15 0700  BP: 139/82  Pulse: 94  Temp: 37 C  Resp: 19    Complications: No apparent anesthesia complications
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
## 2015-06-08 ENCOUNTER — Inpatient Hospital Stay (HOSPITAL_COMMUNITY): Payer: No Typology Code available for payment source

## 2015-06-08 MED ORDER — METOPROLOL TARTRATE 25 MG PO TABS
25.0000 mg | ORAL_TABLET | Freq: Two times a day (BID) | ORAL | Status: DC
  Administered 2015-06-08 – 2015-06-11 (×6): 25 mg via ORAL
  Filled 2015-06-08 (×7): qty 1

## 2015-06-08 MED ORDER — METOPROLOL TARTRATE 25 MG/10 ML ORAL SUSPENSION
25.0000 mg | Freq: Two times a day (BID) | ORAL | Status: DC
  Filled 2015-06-08 (×7): qty 10
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
## 2015-06-08 NOTE — Progress Notes (Signed)
2 Days Post-Op Procedure(s) (LRB): Exploratory Sternotomy, Drainage of Pericardial Effusion, Debridement of Left Chest Wound, Evacuation of Hematoma (N/A) Subjective: No complaints. ambulating  Objective: Vital signs in last 24 hours: Temp:  [98.2 F (36.8 C)-99.4 F (37.4 C)] 98.2 F (36.8 C) (08/07 1900) Pulse Rate:  [102-117] 117 (08/07 2136) Cardiac Rhythm:  [-] Sinus tachycardia (08/07 0800) Resp:  [14-27] 17 (08/07 1900) BP: (104-162)/(69-108) 158/99 mmHg (08/07 2136) SpO2:  [93 %-100 %] 100 % (08/07 1900) Arterial Line BP: (96-185)/(70-122) 96/88 mmHg (08/07 1100) Weight:  [94.1 kg (207 lb 7.3 oz)] 94.1 kg (207 lb 7.3 oz) (08/07 0100)  Hemodynamic parameters for last 24 hours:    Intake/Output from previous day: 08/06 0701 - 08/07 0700 In: 900 [P.O.:360; I.V.:490; IV Piggyback:50] Out: 1565 [Urine:1135; Chest Tube:430] Intake/Output this shift:    General appearance: alert and cooperative Heart: regular rate and rhythm, S1, S2 normal, no murmur, click, rub or gallop Lungs: clear to auscultation bilaterally Chest tubes without air leak Lab Results:  Recent Labs  06/07/15 0345 06/07/15 1702 06/07/15 1705  WBC 20.2*  --  17.5*  HGB 14.8 16.3 15.1  HCT 42.7 48.0 42.6  PLT 212  --  195   BMET:  Recent Labs  06/06/15 0315  06/07/15 0345 06/07/15 1702 06/07/15 1705  NA 144  < > 137 135  --   K 4.1  < > 3.5 3.5  --   CL 114*  --  104 95*  --   CO2 20*  --  27  --   --   GLUCOSE 137*  < > 111* 106*  --   BUN 10  --  7 6  --   CREATININE 1.05  --  0.93 0.90 1.02  CALCIUM 6.9*  --  7.6*  --   --   < > = values in this interval not displayed.  PT/INR:  Recent Labs  06/06/15 0315  LABPROT 16.5*  INR 1.32   ABG    Component Value Date/Time   PHART 7.334* 06/06/2015 1142   HCO3 23.3 06/06/2015 1142   TCO2 26 06/07/2015 1702   ACIDBASEDEF 3.0* 06/06/2015 1142   O2SAT 93.0 06/06/2015 1142   CBG (last 3)   Recent Labs  06/06/15 1527 06/07/15 0749  06/07/15 1148  GLUCAP 108* 95 112*    Assessment/Plan: S/P Procedure(s) (LRB): Exploratory Sternotomy, Drainage of Pericardial Effusion, Debridement of Left Chest Wound, Evacuation of Hematoma (N/A)  Stable. Will continue chest tubes to suction today since it is late. Dr. Tyrone Sage will decide about removal and wound vac tomorrow.   LOS: 2 days    [name] 06/08/2015

## 2015-06-09 ENCOUNTER — Encounter (HOSPITAL_COMMUNITY): Payer: Self-pay | Admitting: Cardiothoracic Surgery

## 2015-06-09 ENCOUNTER — Inpatient Hospital Stay (HOSPITAL_COMMUNITY): Payer: No Typology Code available for payment source

## 2015-06-09 LAB — TYPE AND SCREEN
ABO/RH(D): B POS
Antibody Screen: NEGATIVE
UNIT DIVISION: 0
UNIT DIVISION: 0
UNIT DIVISION: 0
UNIT DIVISION: 0
UNIT DIVISION: 0
Unit division: 0
Unit division: 0
Unit division: 0
Unit division: 0
Unit division: 0
Unit division: 0
Unit division: 0

## 2015-06-09 LAB — BASIC METABOLIC PANEL
Anion gap: 8 (ref 5–15)
BUN: <5 mg/dL — ABNORMAL LOW (ref 6–20)
CO2: 32 mmol/L (ref 22–32)
Calcium: 8.4 mg/dL — ABNORMAL LOW (ref 8.9–10.3)
Chloride: 96 mmol/L — ABNORMAL LOW (ref 101–111)
Creatinine, Ser: 1.05 mg/dL (ref 0.61–1.24)
GFR calc Af Amer: >60 mL/min (ref >60)
GFR calc non Af Amer: >60 mL/min (ref >60)
Glucose, Bld: 104 mg/dL — ABNORMAL HIGH (ref 65–99)
Potassium: 3.4 mmol/L — ABNORMAL LOW (ref 3.5–5.1)
Sodium: 136 mmol/L (ref 135–145)

## 2015-06-09 LAB — CBC
HCT: 39.7 % (ref 39.0–52.0)
Hemoglobin: 13.6 g/dL (ref 13.0–17.0)
MCH: 31.6 pg (ref 26.0–34.0)
MCHC: 34.3 g/dL (ref 30.0–36.0)
MCV: 92.1 fL (ref 78.0–100.0)
Platelets: 230 10*3/uL (ref 150–400)
RBC: 4.31 MIL/uL (ref 4.22–5.81)
RDW: 13.2 % (ref 11.5–15.5)
WBC: 12.3 10*3/uL — ABNORMAL HIGH (ref 4.0–10.5)

## 2015-06-09 MED ORDER — POTASSIUM CHLORIDE 10 MEQ/50ML IV SOLN
10.0000 meq | INTRAVENOUS | Status: AC
  Administered 2015-06-09 (×2): 10 meq via INTRAVENOUS

## 2015-06-09 MED ORDER — POTASSIUM CHLORIDE 10 MEQ/50ML IV SOLN
10.0000 meq | INTRAVENOUS | Status: DC | PRN
Start: 2015-06-09 — End: 2015-06-11
  Administered 2015-06-09: 10 meq via INTRAVENOUS
  Filled 2015-06-09 (×4): qty 50

## 2015-06-09 MED FILL — Heparin Sodium (Porcine) Inj 1000 Unit/ML: INTRAMUSCULAR | Qty: 30 | Status: AC

## 2015-06-09 MED FILL — Potassium Chloride Inj 2 mEq/ML: INTRAVENOUS | Qty: 40 | Status: AC

## 2015-06-09 MED FILL — Magnesium Sulfate Inj 50%: INTRAMUSCULAR | Qty: 10 | Status: AC
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
## 2015-06-09 NOTE — Progress Notes (Signed)
EKG CRITICAL VALUE     12 lead EKG performed.  Critical value noted. Joni Reining, RN notified.   Wandalee Ferdinand, [place] 06/09/2015 7:27 AM
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
## 2015-06-10 ENCOUNTER — Encounter (HOSPITAL_COMMUNITY): Payer: Self-pay | Admitting: Cardiology

## 2015-06-10 ENCOUNTER — Inpatient Hospital Stay (HOSPITAL_COMMUNITY): Payer: No Typology Code available for payment source

## 2015-06-10 LAB — BASIC METABOLIC PANEL
Anion gap: 6 (ref 5–15)
BUN: 6 mg/dL (ref 6–20)
CO2: 30 mmol/L (ref 22–32)
Calcium: 8.5 mg/dL — ABNORMAL LOW (ref 8.9–10.3)
Chloride: 101 mmol/L (ref 101–111)
Creatinine, Ser: 1.12 mg/dL (ref 0.61–1.24)
GFR calc Af Amer: >60 mL/min (ref >60)
GFR calc non Af Amer: >60 mL/min (ref >60)
Glucose, Bld: 104 mg/dL — ABNORMAL HIGH (ref 65–99)
Potassium: 3.7 mmol/L (ref 3.5–5.1)
Sodium: 137 mmol/L (ref 135–145)

## 2015-06-10 LAB — CBC
HCT: 41.2 % (ref 39.0–52.0)
Hemoglobin: 14 g/dL (ref 13.0–17.0)
MCH: 31.2 pg (ref 26.0–34.0)
MCHC: 34 g/dL (ref 30.0–36.0)
MCV: 91.8 fL (ref 78.0–100.0)
Platelets: 252 10*3/uL (ref 150–400)
RBC: 4.49 MIL/uL (ref 4.22–5.81)
RDW: 13 % (ref 11.5–15.5)
WBC: 10.8 10*3/uL — ABNORMAL HIGH (ref 4.0–10.5)

## 2015-06-10 MED ORDER — CLONAZEPAM 0.5 MG PO TBDP
1.0000 mg | ORAL_TABLET | Freq: Two times a day (BID) | ORAL | Status: DC
  Administered 2015-06-11: 1 mg via ORAL
  Filled 2015-06-10: qty 2

## 2015-06-10 NOTE — Progress Notes (Addendum)
      [address]       [place] [id number]             [phone]      4 Days Post-Op Procedure(s) (LRB): Exploratory Sternotomy, Drainage of Pericardial Effusion, Debridement of Left Chest Wound, Evacuation of Hematoma (N/A)   Subjective:  No new complaints.  Remains stable  Objective: Vital signs in last 24 hours: Temp:  [98.1 F (36.7 C)-98.9 F (37.2 C)] 98.2 F (36.8 C) (08/09 0400) Pulse Rate:  [87-111] 111 (08/09 0900) Cardiac Rhythm:  [-] Normal sinus rhythm (08/09 0800) Resp:  [9-33] 23 (08/09 0900) BP: (111-163)/(81-104) 144/96 mmHg (08/09 0900) SpO2:  [92 %-100 %] 97 % (08/09 0900) Weight:  [199 lb 8.3 oz (90.5 kg)] 199 lb 8.3 oz (90.5 kg) (08/09 0600)  Intake/Output from previous day: 08/08 0701 - 08/09 0700 In: 910 [P.O.:720; I.V.:90; IV Piggyback:100] Out: 1815 [Urine:1705; Chest Tube:110] Intake/Output this shift: Total I/O In: -  Out: 25 [Drains:25]  General appearance: alert, cooperative and no distress Heart: regular rate and rhythm, S1, S2 normal, no murmur, click, rub or gallop Lungs: diminished breath sounds LLL Abdomen: soft, non-tender; bowel sounds normal; no masses,  no organomegaly Wound: wound vac in place  Lab Results:  Recent Labs  06/09/15 0419 06/10/15 0339  WBC 12.3* 10.8*  HGB 13.6 14.0  HCT 39.7 41.2  PLT 230 252   BMET:  Recent Labs  06/09/15 0419 06/10/15 0339  NA 136 137  K 3.4* 3.7  CL 96* 101  CO2 32 30  GLUCOSE 104* 104*  BUN <5* 6  CREATININE 1.05 1.12  CALCIUM 8.4* 8.5*    PT/INR: No results for input(s): LABPROT, INR in the last 72 hours. ABG    Component Value Date/Time   PHART 7.334* 06/06/2015 1142   HCO3 23.3 06/06/2015 1142   TCO2 26 06/07/2015 1702   ACIDBASEDEF 3.0* 06/06/2015 1142   O2SAT 93.0 06/06/2015 1142   CBG (last 3)   Recent Labs  06/07/15 1148  GLUCAP 112*    Assessment/Plan: S/P Procedure(s) (LRB): Exploratory Sternotomy, Drainage of Pericardial  Effusion, Debridement of Left Chest Wound, Evacuation of Hematoma (N/A)  1. Chest tube will be removed today 2. Wound vac in place, scheduled for MWF changes 3. Dispo- patient stable, can transfer to step down   LOS: 4 days    BARRETT, ERIN 06/10/2015  Chest tube out today I have seen and examined Ardean Larsen and agree with the above assessment  and plan.  Delight Ovens MD Beeper [phone] Office [phone] 06/10/2015 6:27 PM
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
## 2015-06-11 ENCOUNTER — Inpatient Hospital Stay (HOSPITAL_COMMUNITY): Payer: No Typology Code available for payment source

## 2015-06-11 MED ORDER — TRAMADOL HCL 50 MG PO TABS: 50.0000 mg | ORAL_TABLET | ORAL | Status: DC | PRN

## 2015-06-11 MED ORDER — OXYCODONE HCL 5 MG PO TABS: 5.0000 mg | ORAL_TABLET | ORAL | Status: DC | PRN

## 2015-06-11 MED ORDER — ASPIRIN 325 MG PO TBEC: 325.0000 mg | DELAYED_RELEASE_TABLET | Freq: Every day | ORAL | Status: DC

## 2015-06-11 NOTE — Discharge Instructions (Signed)

## 2015-06-11 NOTE — Progress Notes (Signed)
Patient discharged to home with instructions. 
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
## 2015-06-11 NOTE — Care Management Note (Signed)
Case Management Note  Patient Details  Name: [name] MRN: [id number] Date of Birth: 01-26-1991  Subjective/Objective:  Pt for dc home today with fiance.  Will not need wound VAC, per MD.  Will need HHRN for wound care.  Pt is uninsured, and is eligible for medication assistance through [facility] program.                    Action/Plan: Referral to [facility] for [facility] needs.  MATCH letter given to pt's fiance, with explanation of program benefits.  They are appreciative of help.    Expected Discharge Date:   06/11/15               Expected Discharge Plan:  Home w Home Health Services  In-House Referral:     Discharge planning Services  CM Consult, MATCH Program, Medication Assistance  Post Acute Care Choice:    Choice offered to:  Patient  DME Arranged:    DME Agency:     HH Arranged:  RN HH Agency:  [facility]  Status of Service:  Completed, signed off  Medicare Important Message Given:    Date Medicare IM Given:    Medicare IM give by:    Date Additional Medicare IM Given:    Additional Medicare Important Message give by:     If discussed at Long Length of Stay Meetings, dates discussed:    Additional Comments:  Quintella Baton, RN, BSN  Trauma/Neuro ICU Case Manager [phone]
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
## 2015-06-11 NOTE — Addendum Note (Signed)
Addendum  created 06/11/15 0716 by Kipp Brood, MD   Modules edited: Notes Section   Notes Section:  File: [id number]; Pend: [id number]; Pend: [id number]
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
## 2015-06-12 ENCOUNTER — Encounter (HOSPITAL_COMMUNITY): Payer: Self-pay | Admitting: Emergency Medicine

## 2015-06-12 DIAGNOSIS — Z48813 Encounter for surgical aftercare following surgery on the respiratory system: Secondary | ICD-10-CM

## 2015-06-17 MED FILL — Cefuroxime Sodium For Inj 750 MG: INTRAMUSCULAR | Qty: 750 | Status: AC

## 2015-06-17 MED FILL — Aminocaproic Acid Inj 250 MG/ML: INTRAVENOUS | Qty: 40 | Status: AC

## 2015-06-17 MED FILL — Sodium Chloride IV Soln 0.9%: INTRAVENOUS | Qty: 100 | Status: AC

## 2015-06-17 MED FILL — Insulin Regular (Human) Inj 100 Unit/ML: INTRAMUSCULAR | Qty: 2.5 | Status: AC

## 2015-06-18 ENCOUNTER — Ambulatory Visit (INDEPENDENT_AMBULATORY_CARE_PROVIDER_SITE_OTHER): Payer: Self-pay | Admitting: *Deleted

## 2015-06-18 DIAGNOSIS — I313 Pericardial effusion (noninflammatory): Secondary | ICD-10-CM

## 2015-06-18 DIAGNOSIS — I3139 Other pericardial effusion (noninflammatory): Secondary | ICD-10-CM

## 2015-06-18 DIAGNOSIS — W3400XD Accidental discharge from unspecified firearms or gun, subsequent encounter: Secondary | ICD-10-CM

## 2015-06-18 DIAGNOSIS — S21109A Unspecified open wound of unspecified front wall of thorax without penetration into thoracic cavity, initial encounter: Secondary | ICD-10-CM

## 2015-06-18 DIAGNOSIS — IMO0001 Reserved for inherently not codable concepts without codable children: Secondary | ICD-10-CM

## 2015-06-18 DIAGNOSIS — Z4802 Encounter for removal of sutures: Secondary | ICD-10-CM

## 2015-06-18 DIAGNOSIS — S2690XD Unspecified injury of heart, unspecified with or without hemopericardium, subsequent encounter: Secondary | ICD-10-CM
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
## 2015-06-20 ENCOUNTER — Other Ambulatory Visit: Payer: Self-pay | Admitting: *Deleted

## 2015-06-20 DIAGNOSIS — G8918 Other acute postprocedural pain: Secondary | ICD-10-CM

## 2015-06-20 MED ORDER — OXYCODONE HCL 5 MG PO TABS: 5.0000 mg | ORAL_TABLET | ORAL | Status: DC | PRN
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
## 2015-06-24 ENCOUNTER — Other Ambulatory Visit: Payer: Self-pay | Admitting: Cardiothoracic Surgery

## 2015-06-24 DIAGNOSIS — W3400XD Accidental discharge from unspecified firearms or gun, subsequent encounter: Secondary | ICD-10-CM

## 2015-06-24 DIAGNOSIS — S21139D Puncture wound without foreign body of unspecified front wall of thorax without penetration into thoracic cavity, subsequent encounter: Principal | ICD-10-CM

## 2015-06-25 ENCOUNTER — Ambulatory Visit (INDEPENDENT_AMBULATORY_CARE_PROVIDER_SITE_OTHER): Payer: Self-pay | Admitting: Cardiothoracic Surgery

## 2015-06-25 ENCOUNTER — Ambulatory Visit
Admission: RE | Admit: 2015-06-25 | Discharge: 2015-06-25 | Disposition: A | Discharge status: Home / Self Care | Payer: No Typology Code available for payment source | Source: Ambulatory Visit | Attending: Cardiothoracic Surgery | Admitting: Cardiothoracic Surgery

## 2015-06-25 ENCOUNTER — Encounter: Payer: Self-pay | Admitting: Cardiothoracic Surgery

## 2015-06-25 VITALS — BP 140/90 | HR 76 | Resp 20 | Ht 72.0 in | Wt 205.0 lb

## 2015-06-25 DIAGNOSIS — W3400XD Accidental discharge from unspecified firearms or gun, subsequent encounter: Secondary | ICD-10-CM

## 2015-06-25 DIAGNOSIS — S21139D Puncture wound without foreign body of unspecified front wall of thorax without penetration into thoracic cavity, subsequent encounter: Principal | ICD-10-CM

## 2015-06-25 DIAGNOSIS — S2619XD Other injury of heart without hemopericardium, subsequent encounter: Secondary | ICD-10-CM

## 2015-06-25 DIAGNOSIS — IMO0001 Reserved for inherently not codable concepts without codable children: Secondary | ICD-10-CM

## 2015-06-25 DIAGNOSIS — S2690XD Unspecified injury of heart, unspecified with or without hemopericardium, subsequent encounter: Secondary | ICD-10-CM
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
## 2015-07-10 ENCOUNTER — Encounter: Payer: Self-pay | Admitting: Cardiothoracic Surgery

## 2015-07-10 ENCOUNTER — Ambulatory Visit (INDEPENDENT_AMBULATORY_CARE_PROVIDER_SITE_OTHER): Payer: Self-pay | Admitting: Cardiothoracic Surgery

## 2015-07-10 VITALS — BP 138/94 | HR 86 | Resp 16 | Ht 72.0 in | Wt 205.0 lb

## 2015-07-10 DIAGNOSIS — IMO0001 Reserved for inherently not codable concepts without codable children: Secondary | ICD-10-CM

## 2015-07-10 DIAGNOSIS — S2690XD Unspecified injury of heart, unspecified with or without hemopericardium, subsequent encounter: Secondary | ICD-10-CM

## 2015-07-10 DIAGNOSIS — W3400XD Accidental discharge from unspecified firearms or gun, subsequent encounter: Secondary | ICD-10-CM

## 2015-07-10 DIAGNOSIS — S2619XD Other injury of heart without hemopericardium, subsequent encounter: Secondary | ICD-10-CM
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
## 2015-07-24 ENCOUNTER — Ambulatory Visit (INDEPENDENT_AMBULATORY_CARE_PROVIDER_SITE_OTHER): Payer: Self-pay | Admitting: Cardiothoracic Surgery

## 2015-07-24 ENCOUNTER — Encounter: Payer: Self-pay | Admitting: Cardiothoracic Surgery

## 2015-07-24 VITALS — BP 141/100 | HR 83 | Resp 16 | Ht 72.0 in | Wt 205.0 lb

## 2015-07-24 DIAGNOSIS — S2619XD Other injury of heart without hemopericardium, subsequent encounter: Secondary | ICD-10-CM

## 2015-07-24 DIAGNOSIS — W3400XD Accidental discharge from unspecified firearms or gun, subsequent encounter: Secondary | ICD-10-CM

## 2015-07-24 DIAGNOSIS — S2690XD Unspecified injury of heart, unspecified with or without hemopericardium, subsequent encounter: Secondary | ICD-10-CM

## 2015-07-24 DIAGNOSIS — IMO0001 Reserved for inherently not codable concepts without codable children: Secondary | ICD-10-CM
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
## 2015-08-14 ENCOUNTER — Encounter: Payer: Self-pay | Admitting: Cardiothoracic Surgery

## 2015-09-03 ENCOUNTER — Encounter: Payer: Self-pay | Admitting: Cardiothoracic Surgery

## 2015-09-09 ENCOUNTER — Encounter: Payer: Self-pay | Admitting: Cardiothoracic Surgery

## 2015-10-02 ENCOUNTER — Encounter: Payer: Self-pay | Admitting: Cardiothoracic Surgery

## 2015-10-03 ENCOUNTER — Telehealth: Payer: Self-pay | Admitting: *Deleted

## 2015-12-31 ENCOUNTER — Encounter (HOSPITAL_COMMUNITY): Payer: Self-pay | Admitting: *Deleted

## 2015-12-31 ENCOUNTER — Emergency Department (HOSPITAL_COMMUNITY)
Admission: EM | Admit: 2015-12-31 | Discharge: 2016-01-01 | Disposition: A | Discharge status: Home / Self Care | Payer: No Typology Code available for payment source | Attending: Emergency Medicine | Admitting: Emergency Medicine

## 2015-12-31 DIAGNOSIS — J45909 Unspecified asthma, uncomplicated: Secondary | ICD-10-CM | POA: Insufficient documentation

## 2015-12-31 DIAGNOSIS — Y999 Unspecified external cause status: Secondary | ICD-10-CM | POA: Insufficient documentation

## 2015-12-31 DIAGNOSIS — Y9389 Activity, other specified: Secondary | ICD-10-CM | POA: Diagnosis not present

## 2015-12-31 DIAGNOSIS — Z951 Presence of aortocoronary bypass graft: Secondary | ICD-10-CM | POA: Diagnosis not present

## 2015-12-31 DIAGNOSIS — F17210 Nicotine dependence, cigarettes, uncomplicated: Secondary | ICD-10-CM | POA: Diagnosis not present

## 2015-12-31 DIAGNOSIS — Y92410 Unspecified street and highway as the place of occurrence of the external cause: Secondary | ICD-10-CM | POA: Diagnosis not present

## 2015-12-31 DIAGNOSIS — S0501XA Injury of conjunctiva and corneal abrasion without foreign body, right eye, initial encounter: Secondary | ICD-10-CM | POA: Insufficient documentation

## 2015-12-31 HISTORY — DX: Unspecified asthma, uncomplicated: J45.909
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
## 2016-01-01 MED ORDER — HYDROCODONE-ACETAMINOPHEN 5-325 MG PO TABS: 1.0000 | ORAL_TABLET | Freq: Four times a day (QID) | ORAL | Status: DC | PRN

## 2016-01-01 MED ORDER — FLUORESCEIN SODIUM 1 MG OP STRP
ORAL_STRIP | OPHTHALMIC | Status: AC
  Filled 2016-01-01: qty 1

## 2016-01-01 MED ORDER — GENTAMICIN SULFATE 0.3 % OP SOLN: 2.0000 [drp] | Freq: Four times a day (QID) | OPHTHALMIC | Status: DC

## 2016-01-01 MED ORDER — ACETAMINOPHEN 500 MG PO TABS
1000.0000 mg | ORAL_TABLET | Freq: Once | ORAL | Status: AC
  Administered 2016-01-01: 1000 mg via ORAL
  Filled 2016-01-01: qty 2

## 2016-01-01 MED ORDER — TETRACAINE HCL 0.5 % OP SOLN: 2.0000 [drp] | Freq: Once | OPHTHALMIC | Status: DC

## 2016-01-01 NOTE — Discharge Instructions (Signed)
Gentamicin drops as prescribed.  Hydrocodone as prescribed as needed for pain.   Follow-up with ophthalmology if not improving in the next 2-3 days, and return to the ER if symptoms significantly worsen or change.   Corneal Abrasion The cornea is the clear covering at the front and center of the eye. When looking at the colored portion of the eye (iris), you are looking through the cornea. This very thin tissue is made up of many layers. The surface layer is a single layer of cells (corneal epithelium) and is one of the most sensitive tissues in the body. If a scratch or injury causes the corneal epithelium to come off, it is called a corneal abrasion. If the injury extends to the tissues below the epithelium, the condition is called a corneal ulcer. CAUSES   Scratches.  Trauma.  Foreign body in the eye. Some people have recurrences of abrasions in the area of the original injury even after it has healed (recurrent erosion syndrome). Recurrent erosion syndrome generally improves and goes away with time. SYMPTOMS   Eye pain.  Difficulty or inability to keep the injured eye open.  The eye becomes very sensitive to light.  Recurrent erosions tend to happen suddenly, first thing in the morning, usually after waking up and opening the eye. DIAGNOSIS  Your health care provider can diagnose a corneal abrasion during an eye exam. Dye is usually placed in the eye using a drop or a small paper strip moistened by your tears. When the eye is examined with a special light, the abrasion shows up clearly because of the dye. TREATMENT   Small abrasions may be treated with antibiotic drops or ointment alone.  A pressure patch may be put over the eye. If this is done, follow your doctor's instructions for when to remove the patch. Do not drive or use machines while the eye patch is on. Judging distances is hard to do with a patch on. If the abrasion becomes infected and spreads to the deeper tissues  of the cornea, a corneal ulcer can result. This is serious because it can cause corneal scarring. Corneal scars interfere with light passing through the cornea and cause a loss of vision in the involved eye. HOME CARE INSTRUCTIONS  Use medicine or ointment as directed. Only take over-the-counter or prescription medicines for pain, discomfort, or fever as directed by your health care provider.  Do not drive or operate machinery if your eye is patched. Your ability to judge distances is impaired.  If your health care provider has given you a follow-up appointment, it is very important to keep that appointment. Not keeping the appointment could result in a severe eye infection or permanent loss of vision. If there is any problem keeping the appointment, let your health care provider know. SEEK MEDICAL CARE IF:   You have pain, light sensitivity, and a scratchy feeling in one eye or both eyes.  Your pressure patch keeps loosening up, and you can blink your eye under the patch after treatment.  Any kind of discharge develops from the eye after treatment or if the lids stick together in the morning.  You have the same symptoms in the morning as you did with the original abrasion days, weeks, or months after the abrasion healed.   This information is not intended to replace advice given to you by your health care provider. Make sure you discuss any questions you have with your health care provider.   Document Released: 10/15/2000 Document  Revised: 07/09/2015 Document Reviewed: 06/25/2013 Elsevier Interactive Patient Education [employer]2016 Elsevier Inc.

## 2016-02-02 ENCOUNTER — Emergency Department (HOSPITAL_COMMUNITY)
Admission: EM | Admit: 2016-02-02 | Discharge: 2016-02-02 | Disposition: A | Discharge status: Home / Self Care | Payer: No Typology Code available for payment source | Attending: Emergency Medicine | Admitting: Emergency Medicine

## 2016-02-02 ENCOUNTER — Encounter (HOSPITAL_COMMUNITY): Payer: Self-pay | Admitting: *Deleted

## 2016-02-02 DIAGNOSIS — Y9389 Activity, other specified: Secondary | ICD-10-CM | POA: Diagnosis not present

## 2016-02-02 DIAGNOSIS — F17210 Nicotine dependence, cigarettes, uncomplicated: Secondary | ICD-10-CM | POA: Diagnosis not present

## 2016-02-02 DIAGNOSIS — H578 Other specified disorders of eye and adnexa: Secondary | ICD-10-CM | POA: Diagnosis present

## 2016-02-02 DIAGNOSIS — Y998 Other external cause status: Secondary | ICD-10-CM | POA: Insufficient documentation

## 2016-02-02 DIAGNOSIS — R51 Headache: Secondary | ICD-10-CM | POA: Diagnosis not present

## 2016-02-02 DIAGNOSIS — S0501XA Injury of conjunctiva and corneal abrasion without foreign body, right eye, initial encounter: Secondary | ICD-10-CM | POA: Diagnosis not present

## 2016-02-02 DIAGNOSIS — Z792 Long term (current) use of antibiotics: Secondary | ICD-10-CM | POA: Insufficient documentation

## 2016-02-02 DIAGNOSIS — J45909 Unspecified asthma, uncomplicated: Secondary | ICD-10-CM | POA: Insufficient documentation

## 2016-02-02 DIAGNOSIS — X58XXXA Exposure to other specified factors, initial encounter: Secondary | ICD-10-CM | POA: Diagnosis not present

## 2016-02-02 DIAGNOSIS — Y9289 Other specified places as the place of occurrence of the external cause: Secondary | ICD-10-CM | POA: Diagnosis not present

## 2016-02-02 MED ORDER — FLUORESCEIN SODIUM 1 MG OP STRP
1.0000 | ORAL_STRIP | Freq: Once | OPHTHALMIC | Status: AC
Start: 2016-02-02 — End: 2016-02-02
  Administered 2016-02-02: 1 via OPHTHALMIC
  Filled 2016-02-02: qty 1

## 2016-02-02 MED ORDER — ERYTHROMYCIN 5 MG/GM OP OINT
1.0000 "application " | TOPICAL_OINTMENT | Freq: Four times a day (QID) | OPHTHALMIC | Status: DC
  Administered 2016-02-02: 1 via OPHTHALMIC
  Filled 2016-02-02: qty 3.5

## 2016-02-02 MED ORDER — TETRACAINE HCL 0.5 % OP SOLN
2.0000 [drp] | Freq: Once | OPHTHALMIC | Status: AC
Start: 2016-02-02 — End: 2016-02-02
  Administered 2016-02-02: 2 [drp] via OPHTHALMIC
  Filled 2016-02-02: qty 2

## 2016-02-02 NOTE — ED Notes (Signed)
Declined W/C at D/C and was escorted to lobby by RN. 

## 2016-02-02 NOTE — ED Provider Notes (Signed)
CSN: [id number]     Arrival date & time 02/02/16  1120 History  By signing my name below, I, Tanda Rockers, attest that this documentation has been prepared under the direction and in the presence of [employer], PA-C. Electronically Signed: Tanda Rockers, ED Scribe. 02/02/2016. 12:16 PM.   Chief Complaint  Patient presents with  . Eye Problem   The history is provided by the patient. No language interpreter was used.    HPI Comments: [name] is a 25 y.o. male who presents to the Emergency Department complaining of gradual onset, constant, foreign body sensation with redness and irritation to the right eye x 6 days. No known injury to the eye. Pt also complains of intermittent headaches from the eye pain. He has been applying eye drops and water to the eye in an attempt to flush the eye without success. No recent exposure to pinkeye. Pt does not wear contacts or glasses. Denies any other symptoms.    Past Medical History  Diagnosis Date  . Smoking 1/2 pack a day or less   . Asthma    Past Surgical History  Procedure Laterality Date  . 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 No pertinent family history. Social History  Substance Use Topics  . Smoking status: Current Every Day Smoker    Types: Cigarettes  . Smokeless tobacco: None  . Alcohol Use: Yes    Review of Systems  Constitutional: Negative for fever.  Eyes: Positive for pain (right eye) and redness.  Neurological: Positive for headaches.   Allergies  Review of patient's allergies indicates no known allergies.  Home Medications   Prior to Admission medications   Medication Sig Start Date End Date Taking? Authorizing Provider  gentamicin (GARAMYCIN) 0.3 %  ophthalmic solution Place 2 drops into the right eye 4 (four) times daily. 01/01/16   Geoffery Lyons, MD  HYDROcodone-acetaminophen (NORCO) 5-325 MG tablet Take 1-2 tablets by mouth every 6 (six) hours as needed. 01/01/16   Geoffery Lyons, MD   BP 159/105 mmHg  Pulse 79  Temp(Src) 98.1 F (36.7 C) (Oral)  Resp 16  SpO2 100%   Physical Exam  Constitutional: He is oriented to person, place, and time. He appears well-developed and well-nourished. No distress.  HENT:  Head: Normocephalic and atraumatic.  Eyes: EOM and lids are normal. Pupils are equal, round, and reactive to light. Lids are everted and swept, no foreign bodies found. Right conjunctiva is injected.  Slit lamp exam:      The right eye shows corneal abrasion and fluorescein uptake.    Injected right conjunctiva. PEERL. No foreign body could be seen. Tearing on exam. Photophobia present. Corneal abrasion at 3 and 4 o'clock position.   Neck: Neck supple. No tracheal deviation present.  Cardiovascular: Normal rate.   Pulmonary/Chest: Effort normal. No respiratory distress.  Musculoskeletal: Normal range of motion.  Neurological: He is alert and oriented to person, place, and time.  Skin: Skin is warm and dry.  Psychiatric: He has a normal mood and affect. His behavior is normal.  Nursing note and vitals reviewed.    Visual Acuity  Right Eye Distance: 20/50 without corrective lens Left Eye Distance: 20/20 without corrective lens Bilateral Distance: 20/20 without corrective lens  Right Eye Near:  Left Eye Near:    Bilateral Near:     ED Course  Procedures (including critical care time)  DIAGNOSTIC STUDIES: Oxygen Saturation is 100% on RA, normal by my interpretation.    COORDINATION OF CARE: 12:15 PM-Discussed treatment plan which includes fluorescein strip test with pt at bedside and pt agreed to plan.   Labs Review Labs Reviewed - No data to display  Imaging Review No results found.   EKG Interpretation None       MDM   Final diagnoses:  Corneal abrasion, right, initial encounter  Patient presents for right eye redness and pain. Vitals stable. Patient has corneal abrasion. He was given erythromycin antibiotic. I discussed following up with ophthalmology. Patient agrees with plan.  I personally performed the services described in this documentation, which was scribed in my presence. The recorded information has been reviewed and is accurate.      Catha GosselinHanna Patel-Mills, PA-C 02/02/16 1555  Laurence Spatesachel Morgan Little, MD 02/03/16 [phone]

## 2016-02-02 NOTE — ED Notes (Signed)
Pt reports right eye pain, redness and irritation since wed.

## 2016-02-02 NOTE — Discharge Instructions (Signed)
Corneal Abrasion Follow-up with ophthalmology. Do not rub eye. The cornea is the clear covering at the front and center of the eye. When you look at the colored portion of the eye, you are looking through the cornea. It is a thin tissue made up of layers. The top layer is the most sensitive layer. A corneal abrasion happens if this layer is scratched or an injury causes it to come off.  HOME CARE  You may be given drops or a medicated cream. Use the medicine as told by your doctor.  A pressure patch may be put over the eye. If this is done, follow your doctor's instructions for when to remove the patch. Do not drive or use machines while the eye patch is on. Judging distances is hard to do with a patch on.  See your doctor for a follow-up exam if you are told to do so. It is very important that you keep this appointment. GET HELP IF:   You have pain, are sensitive to light, and have a scratchy feeling in one eye or both eyes.  Your pressure patch keeps getting loose. You can blink your eye under the patch.  You have fluid coming from your eye or the lids stick together in the morning.  You have the same symptoms in the morning that you did with the first abrasion. This could be days, weeks, or months after the first abrasion healed.   This information is not intended to replace advice given to you by your health care provider. Make sure you discuss any questions you have with your health care provider.   Document Released: 04/05/2008 Document Revised: 07/09/2015 Document Reviewed: 06/25/2013 Elsevier Interactive Patient Education [employer]2016 Elsevier Inc.
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
## 2017-12-14 ENCOUNTER — Emergency Department (HOSPITAL_COMMUNITY)
Admission: EM | Admit: 2017-12-14 | Discharge: 2017-12-14 | Disposition: A | Discharge status: Home / Self Care | Payer: Self-pay | Attending: Emergency Medicine | Admitting: Emergency Medicine

## 2017-12-14 ENCOUNTER — Encounter (HOSPITAL_COMMUNITY): Payer: Self-pay

## 2017-12-14 DIAGNOSIS — J45909 Unspecified asthma, uncomplicated: Secondary | ICD-10-CM | POA: Insufficient documentation

## 2017-12-14 DIAGNOSIS — R197 Diarrhea, unspecified: Secondary | ICD-10-CM | POA: Insufficient documentation

## 2017-12-14 DIAGNOSIS — F17210 Nicotine dependence, cigarettes, uncomplicated: Secondary | ICD-10-CM | POA: Insufficient documentation

## 2017-12-14 DIAGNOSIS — R112 Nausea with vomiting, unspecified: Secondary | ICD-10-CM

## 2017-12-14 DIAGNOSIS — I10 Essential (primary) hypertension: Secondary | ICD-10-CM | POA: Insufficient documentation

## 2017-12-14 DIAGNOSIS — Z951 Presence of aortocoronary bypass graft: Secondary | ICD-10-CM | POA: Insufficient documentation

## 2017-12-14 LAB — COMPREHENSIVE METABOLIC PANEL
ALBUMIN: 4.4 g/dL (ref 3.5–5.0)
ALT: 40 U/L (ref 17–63)
AST: 32 U/L (ref 15–41)
Alkaline Phosphatase: 86 U/L (ref 38–126)
Anion gap: 11 (ref 5–15)
BILIRUBIN TOTAL: 1.4 mg/dL — AB (ref 0.3–1.2)
BUN: 8 mg/dL (ref 6–20)
CO2: 25 mmol/L (ref 22–32)
CREATININE: 1.23 mg/dL (ref 0.61–1.24)
Calcium: 9.6 mg/dL (ref 8.9–10.3)
Chloride: 100 mmol/L — ABNORMAL LOW (ref 101–111)
GFR calc Af Amer: >60 mL/min (ref >60)
Glucose, Bld: 99 mg/dL (ref 65–99)
POTASSIUM: 3.8 mmol/L (ref 3.5–5.1)
Sodium: 136 mmol/L (ref 135–145)
Total Protein: 8 g/dL (ref 6.5–8.1)

## 2017-12-14 LAB — LIPASE, BLOOD: LIPASE: 37 U/L (ref 11–51)

## 2017-12-14 LAB — CBC WITH DIFFERENTIAL/PLATELET
BASOS ABS: 0 10*3/uL (ref 0.0–0.1)
Basophils Relative: 0 %
Eosinophils Absolute: 0.2 10*3/uL (ref 0.0–0.7)
Eosinophils Relative: 3 %
HEMATOCRIT: 48.3 % (ref 39.0–52.0)
Hemoglobin: 17 g/dL (ref 13.0–17.0)
Lymphocytes Relative: 20 %
Lymphs Abs: 1.2 10*3/uL (ref 0.7–4.0)
MCH: 32 pg (ref 26.0–34.0)
MCHC: 35.2 g/dL (ref 30.0–36.0)
MCV: 91 fL (ref 78.0–100.0)
MONO ABS: 0.5 10*3/uL (ref 0.1–1.0)
MONOS PCT: 8 %
NEUTROS ABS: 4.4 10*3/uL (ref 1.7–7.7)
Neutrophils Relative %: 69 %
Platelets: 255 10*3/uL (ref 150–400)
RBC: 5.31 MIL/uL (ref 4.22–5.81)
RDW: 12.9 % (ref 11.5–15.5)
WBC: 6.3 10*3/uL (ref 4.0–10.5)

## 2017-12-14 MED ORDER — BISMUTH SUBSALICYLATE 262 MG/15ML PO SUSP
30.0000 mL | Freq: Once | ORAL | Status: DC
  Filled 2017-12-14: qty 118

## 2017-12-14 MED ORDER — ONDANSETRON 4 MG PO TBDP: 4.0000 mg | ORAL_TABLET | Freq: Three times a day (TID) | ORAL | 0 refills | Status: DC | PRN

## 2017-12-14 MED ORDER — ONDANSETRON 4 MG PO TBDP
4.0000 mg | ORAL_TABLET | Freq: Once | ORAL | Status: AC
  Administered 2017-12-14: 4 mg via ORAL
  Filled 2017-12-14: qty 1

## 2017-12-14 NOTE — ED Triage Notes (Signed)
Per Pt, Pt is coming from home with abdominal pain, nausea, vomiting that started yesterday. Pt has not eaten since. He has continued to have diarrhea.

## 2017-12-14 NOTE — ED Notes (Signed)
Pt able to tolerate fluids 

## 2017-12-14 NOTE — Discharge Instructions (Addendum)
While in the ED your blood pressure was high.  Please follow up with your primary care doctor or the wellness clinic for repeat evaluation as you may need medication.  High blood pressure can cause long term, potentially serious, damage if left untreated.  ° °
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
## 2017-12-14 NOTE — ED Provider Notes (Signed)
  Patient placed in Quick Look pathway, seen and evaluated for chief complaint of abdominal pain, nausea, vomiting and diarrhea.  Pertinent H&P findings include Speech is clear and coherent, non-toxic appearing, no respiratory distress.  Generalized abdominal pain. .  Based on initial evaluation, labs are indicated and radiology studies are not indicated.  Patient counseled on process, plan, and necessity for staying for completing the evaluation.   Vitals:   12/14/17 1335 12/14/17 1415  BP: (!) 157/116   Pulse: 78   Resp: 16   Temp: 98 F (36.7 C)   TempSrc: Oral   SpO2: 100%   Weight:  83 kg (183 lb)  Height:  5\' 6"  (1.676 m)      Everlene FarrierDansie, Jakiyah Stepney, PA-C 12/14/17 1418    Cathren LaineSteinl, Kevin, MD 12/14/17 1440

## 2018-01-29 ENCOUNTER — Encounter (HOSPITAL_COMMUNITY): Payer: Self-pay | Admitting: Emergency Medicine

## 2018-01-29 ENCOUNTER — Emergency Department (HOSPITAL_COMMUNITY)
Admission: EM | Admit: 2018-01-29 | Discharge: 2018-01-29 | Disposition: A | Discharge status: Home / Self Care | Payer: Self-pay | Attending: Emergency Medicine | Admitting: Emergency Medicine

## 2018-01-29 DIAGNOSIS — S41012A Laceration without foreign body of left shoulder, initial encounter: Secondary | ICD-10-CM | POA: Insufficient documentation

## 2018-01-29 DIAGNOSIS — Z951 Presence of aortocoronary bypass graft: Secondary | ICD-10-CM | POA: Insufficient documentation

## 2018-01-29 DIAGNOSIS — Y929 Unspecified place or not applicable: Secondary | ICD-10-CM | POA: Insufficient documentation

## 2018-01-29 DIAGNOSIS — W228XXA Striking against or struck by other objects, initial encounter: Secondary | ICD-10-CM | POA: Insufficient documentation

## 2018-01-29 DIAGNOSIS — Y998 Other external cause status: Secondary | ICD-10-CM | POA: Insufficient documentation

## 2018-01-29 DIAGNOSIS — Z79899 Other long term (current) drug therapy: Secondary | ICD-10-CM | POA: Insufficient documentation

## 2018-01-29 DIAGNOSIS — J45909 Unspecified asthma, uncomplicated: Secondary | ICD-10-CM | POA: Insufficient documentation

## 2018-01-29 DIAGNOSIS — Y9383 Activity, rough housing and horseplay: Secondary | ICD-10-CM | POA: Insufficient documentation

## 2018-01-29 DIAGNOSIS — Z23 Encounter for immunization: Secondary | ICD-10-CM | POA: Insufficient documentation

## 2018-01-29 DIAGNOSIS — F17210 Nicotine dependence, cigarettes, uncomplicated: Secondary | ICD-10-CM | POA: Insufficient documentation

## 2018-01-29 MED ORDER — TETANUS-DIPHTH-ACELL PERTUSSIS 5-2.5-18.5 LF-MCG/0.5 IM SUSP
0.5000 mL | Freq: Once | INTRAMUSCULAR | Status: AC
  Administered 2018-01-29: 0.5 mL via INTRAMUSCULAR
  Filled 2018-01-29: qty 0.5

## 2018-01-29 MED ORDER — LIDOCAINE-EPINEPHRINE (PF) 2 %-1:200000 IJ SOLN
10.0000 mL | Freq: Once | INTRAMUSCULAR | Status: AC
  Administered 2018-01-29: 10 mL via INTRADERMAL
  Filled 2018-01-29: qty 20

## 2018-01-29 NOTE — Discharge Instructions (Addendum)
Please have your sutures remove in 5-7 days.  Return sooner if you notice signs of infection.
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
## 2018-01-29 NOTE — ED Notes (Signed)
Pt has lac to left upper arm, bleeding controlled and APP at bedside continuing treatment
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
## 2018-08-19 ENCOUNTER — Emergency Department (HOSPITAL_COMMUNITY)
Admission: EM | Admit: 2018-08-19 | Discharge: 2018-08-19 | Disposition: A | Discharge status: Home / Self Care | Payer: Self-pay | Attending: Emergency Medicine | Admitting: Emergency Medicine

## 2018-08-19 ENCOUNTER — Encounter (HOSPITAL_COMMUNITY): Payer: Self-pay | Admitting: Emergency Medicine

## 2018-08-19 ENCOUNTER — Emergency Department (HOSPITAL_COMMUNITY): Payer: Self-pay

## 2018-08-19 DIAGNOSIS — Y929 Unspecified place or not applicable: Secondary | ICD-10-CM | POA: Insufficient documentation

## 2018-08-19 DIAGNOSIS — X58XXXA Exposure to other specified factors, initial encounter: Secondary | ICD-10-CM | POA: Insufficient documentation

## 2018-08-19 DIAGNOSIS — S62102A Fracture of unspecified carpal bone, left wrist, initial encounter for closed fracture: Secondary | ICD-10-CM

## 2018-08-19 DIAGNOSIS — Z79899 Other long term (current) drug therapy: Secondary | ICD-10-CM | POA: Insufficient documentation

## 2018-08-19 DIAGNOSIS — F17210 Nicotine dependence, cigarettes, uncomplicated: Secondary | ICD-10-CM | POA: Insufficient documentation

## 2018-08-19 DIAGNOSIS — S6292XA Unspecified fracture of left wrist and hand, initial encounter for closed fracture: Secondary | ICD-10-CM | POA: Insufficient documentation

## 2018-08-19 DIAGNOSIS — Y939 Activity, unspecified: Secondary | ICD-10-CM | POA: Insufficient documentation

## 2018-08-19 DIAGNOSIS — Y999 Unspecified external cause status: Secondary | ICD-10-CM | POA: Insufficient documentation

## 2018-08-19 NOTE — ED Notes (Signed)
Pt ambulatory to the bed.  Pt in no acute distress.
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
## 2018-08-19 NOTE — Discharge Instructions (Addendum)
There is a bony abnormality noted on your left hand x-[name] Please use the splint for immobilization, elevate the hand, and use cold therapy as well as Tylenol and ibuprofen for pain Call Dr. Debby Bud office on Monday for recheck and further evaluation.

## 2018-08-19 NOTE — ED Notes (Signed)
Patient Alert and oriented to baseline. Stable and ambulatory to baseline. Patient verbalized understanding of the discharge instructions.  Patient belongings were taken by the patient.   
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
## 2019-02-23 DIAGNOSIS — I214 Non-ST elevation (NSTEMI) myocardial infarction: Secondary | ICD-10-CM

## 2019-02-23 DIAGNOSIS — I5023 Acute on chronic systolic (congestive) heart failure: Secondary | ICD-10-CM

## 2019-02-24 ENCOUNTER — Other Ambulatory Visit: Payer: Self-pay

## 2019-02-24 ENCOUNTER — Observation Stay (HOSPITAL_BASED_OUTPATIENT_CLINIC_OR_DEPARTMENT_OTHER): Payer: Medicaid Other

## 2019-02-24 ENCOUNTER — Encounter (HOSPITAL_COMMUNITY)
Admission: EM | Disposition: A | Discharge status: Home / Self Care | Payer: Self-pay | Source: Home / Self Care | Attending: Family Medicine

## 2019-02-24 ENCOUNTER — Emergency Department (HOSPITAL_COMMUNITY): Payer: Medicaid Other

## 2019-02-24 ENCOUNTER — Inpatient Hospital Stay (HOSPITAL_COMMUNITY)
Admission: EM | Admit: 2019-02-24 | Discharge: 2019-02-26 | DRG: 282 | Disposition: A | Discharge status: Home / Self Care | Payer: Medicaid Other | Attending: Family Medicine | Admitting: Family Medicine

## 2019-02-24 ENCOUNTER — Encounter (HOSPITAL_COMMUNITY): Payer: Self-pay | Admitting: Emergency Medicine

## 2019-02-24 DIAGNOSIS — I200 Unstable angina: Secondary | ICD-10-CM

## 2019-02-24 DIAGNOSIS — R778 Other specified abnormalities of plasma proteins: Secondary | ICD-10-CM | POA: Insufficient documentation

## 2019-02-24 DIAGNOSIS — Z6833 Body mass index (BMI) 33.0-33.9, adult: Secondary | ICD-10-CM

## 2019-02-24 DIAGNOSIS — Z951 Presence of aortocoronary bypass graft: Secondary | ICD-10-CM

## 2019-02-24 DIAGNOSIS — I213 ST elevation (STEMI) myocardial infarction of unspecified site: Secondary | ICD-10-CM | POA: Diagnosis present

## 2019-02-24 DIAGNOSIS — R402252 Coma scale, best verbal response, oriented, at arrival to emergency department: Secondary | ICD-10-CM | POA: Diagnosis present

## 2019-02-24 DIAGNOSIS — I2121 ST elevation (STEMI) myocardial infarction involving left circumflex coronary artery: Secondary | ICD-10-CM

## 2019-02-24 DIAGNOSIS — R402362 Coma scale, best motor response, obeys commands, at arrival to emergency department: Secondary | ICD-10-CM | POA: Diagnosis present

## 2019-02-24 DIAGNOSIS — F17210 Nicotine dependence, cigarettes, uncomplicated: Secondary | ICD-10-CM | POA: Diagnosis present

## 2019-02-24 DIAGNOSIS — I240 Acute coronary thrombosis not resulting in myocardial infarction: Secondary | ICD-10-CM | POA: Diagnosis present

## 2019-02-24 DIAGNOSIS — I513 Intracardiac thrombosis, not elsewhere classified: Secondary | ICD-10-CM | POA: Diagnosis present

## 2019-02-24 DIAGNOSIS — K219 Gastro-esophageal reflux disease without esophagitis: Secondary | ICD-10-CM | POA: Diagnosis present

## 2019-02-24 DIAGNOSIS — I2542 Coronary artery dissection: Secondary | ICD-10-CM | POA: Diagnosis present

## 2019-02-24 DIAGNOSIS — R7989 Other specified abnormal findings of blood chemistry: Secondary | ICD-10-CM

## 2019-02-24 DIAGNOSIS — W3400XA Accidental discharge from unspecified firearms or gun, initial encounter: Secondary | ICD-10-CM

## 2019-02-24 DIAGNOSIS — I25110 Atherosclerotic heart disease of native coronary artery with unstable angina pectoris: Secondary | ICD-10-CM

## 2019-02-24 DIAGNOSIS — S21139A Puncture wound without foreign body of unspecified front wall of thorax without penetration into thoracic cavity, initial encounter: Secondary | ICD-10-CM

## 2019-02-24 DIAGNOSIS — I25119 Atherosclerotic heart disease of native coronary artery with unspecified angina pectoris: Secondary | ICD-10-CM | POA: Diagnosis present

## 2019-02-24 DIAGNOSIS — I252 Old myocardial infarction: Secondary | ICD-10-CM

## 2019-02-24 DIAGNOSIS — Z809 Family history of malignant neoplasm, unspecified: Secondary | ICD-10-CM

## 2019-02-24 DIAGNOSIS — S21132S Puncture wound without foreign body of left front wall of thorax without penetration into thoracic cavity, sequela: Secondary | ICD-10-CM

## 2019-02-24 DIAGNOSIS — R9431 Abnormal electrocardiogram [ECG] [EKG]: Secondary | ICD-10-CM

## 2019-02-24 DIAGNOSIS — E876 Hypokalemia: Secondary | ICD-10-CM | POA: Diagnosis present

## 2019-02-24 DIAGNOSIS — R079 Chest pain, unspecified: Secondary | ICD-10-CM | POA: Diagnosis present

## 2019-02-24 DIAGNOSIS — I2111 ST elevation (STEMI) myocardial infarction involving right coronary artery: Secondary | ICD-10-CM

## 2019-02-24 DIAGNOSIS — Z8249 Family history of ischemic heart disease and other diseases of the circulatory system: Secondary | ICD-10-CM

## 2019-02-24 DIAGNOSIS — Z79899 Other long term (current) drug therapy: Secondary | ICD-10-CM

## 2019-02-24 DIAGNOSIS — R402142 Coma scale, eyes open, spontaneous, at arrival to emergency department: Secondary | ICD-10-CM | POA: Diagnosis present

## 2019-02-24 DIAGNOSIS — Z833 Family history of diabetes mellitus: Secondary | ICD-10-CM

## 2019-02-24 DIAGNOSIS — I10 Essential (primary) hypertension: Secondary | ICD-10-CM | POA: Diagnosis present

## 2019-02-24 DIAGNOSIS — J45909 Unspecified asthma, uncomplicated: Secondary | ICD-10-CM | POA: Diagnosis present

## 2019-02-24 DIAGNOSIS — E785 Hyperlipidemia, unspecified: Secondary | ICD-10-CM | POA: Diagnosis present

## 2019-02-24 DIAGNOSIS — E669 Obesity, unspecified: Secondary | ICD-10-CM | POA: Diagnosis present

## 2019-02-24 DIAGNOSIS — W3400XS Accidental discharge from unspecified firearms or gun, sequela: Secondary | ICD-10-CM

## 2019-02-24 DIAGNOSIS — F10129 Alcohol abuse with intoxication, unspecified: Secondary | ICD-10-CM | POA: Diagnosis present

## 2019-02-24 DIAGNOSIS — R4781 Slurred speech: Secondary | ICD-10-CM | POA: Diagnosis present

## 2019-02-24 DIAGNOSIS — I2119 ST elevation (STEMI) myocardial infarction involving other coronary artery of inferior wall: Principal | ICD-10-CM | POA: Diagnosis present

## 2019-02-24 DIAGNOSIS — Y908 Blood alcohol level of 240 mg/100 ml or more: Secondary | ICD-10-CM | POA: Diagnosis present

## 2019-02-24 HISTORY — DX: Accidental discharge from unspecified firearms or gun, initial encounter: W34.00XA

## 2019-02-24 HISTORY — PX: LEFT HEART CATH AND CORONARY ANGIOGRAPHY: CATH118249

## 2019-02-24 HISTORY — DX: Puncture wound without foreign body of unspecified front wall of thorax without penetration into thoracic cavity, initial encounter: S21.139A

## 2019-02-24 HISTORY — DX: ST elevation (STEMI) myocardial infarction of unspecified site: I21.3

## 2019-02-24 HISTORY — DX: Coronary artery dissection: I25.42

## 2019-02-24 LAB — D-DIMER, QUANTITATIVE: D-Dimer, Quant: 0.5 ug/mL-FEU (ref 0.00–0.50)

## 2019-02-24 LAB — TROPONIN I
Troponin I: <0.03 ng/mL (ref <0.03)
Troponin I: 4.07 ng/mL (ref <0.03)
Troponin I: 51.1 ng/mL (ref <0.03)
Troponin I: >65 ng/mL (ref <0.03)
Troponin I: 63.77 ng/mL (ref <0.03)

## 2019-02-24 LAB — CBC WITH DIFFERENTIAL/PLATELET
Abs Immature Granulocytes: 0.02 10*3/uL (ref 0.00–0.07)
Basophils Absolute: 0 10*3/uL (ref 0.0–0.1)
Basophils Relative: 1 %
Eosinophils Absolute: 0.2 10*3/uL (ref 0.0–0.5)
Eosinophils Relative: 2 %
HCT: 42.6 % (ref 39.0–52.0)
Hemoglobin: 14.5 g/dL (ref 13.0–17.0)
Immature Granulocytes: 0 %
Lymphocytes Relative: 34 %
Lymphs Abs: 2.7 10*3/uL (ref 0.7–4.0)
MCH: 31.3 pg (ref 26.0–34.0)
MCHC: 34 g/dL (ref 30.0–36.0)
MCV: 91.8 fL (ref 80.0–100.0)
Monocytes Absolute: 0.6 10*3/uL (ref 0.1–1.0)
Monocytes Relative: 8 %
Neutro Abs: 4.4 10*3/uL (ref 1.7–7.7)
Neutrophils Relative %: 55 %
Platelets: 324 10*3/uL (ref 150–400)
RBC: 4.64 MIL/uL (ref 4.22–5.81)
RDW: 12.9 % (ref 11.5–15.5)
WBC: 8 10*3/uL (ref 4.0–10.5)
nRBC: 0 % (ref 0.0–0.2)

## 2019-02-24 LAB — HEMOGLOBIN A1C
Hgb A1c MFr Bld: 5.3 % (ref 4.8–5.6)
Mean Plasma Glucose: 105.41 mg/dL

## 2019-02-24 LAB — CBC
HCT: 41.2 % (ref 39.0–52.0)
Hemoglobin: 14.4 g/dL (ref 13.0–17.0)
MCH: 31.3 pg (ref 26.0–34.0)
MCHC: 35 g/dL (ref 30.0–36.0)
MCV: 89.6 fL (ref 80.0–100.0)
Platelets: 280 10*3/uL (ref 150–400)
RBC: 4.6 MIL/uL (ref 4.22–5.81)
RDW: 13 % (ref 11.5–15.5)
WBC: 8.2 10*3/uL (ref 4.0–10.5)
nRBC: 0 % (ref 0.0–0.2)

## 2019-02-24 LAB — HEPATIC FUNCTION PANEL
ALT: 58 U/L — ABNORMAL HIGH (ref 0–44)
AST: 223 U/L — ABNORMAL HIGH (ref 15–41)
Albumin: 4.3 g/dL (ref 3.5–5.0)
Alkaline Phosphatase: 72 U/L (ref 38–126)
Bilirubin, Direct: 0.1 mg/dL (ref 0.0–0.2)
Indirect Bilirubin: 0.6 mg/dL (ref 0.3–0.9)
Total Bilirubin: 0.7 mg/dL (ref 0.3–1.2)
Total Protein: 7.7 g/dL (ref 6.5–8.1)

## 2019-02-24 LAB — RAPID URINE DRUG SCREEN, HOSP PERFORMED
Amphetamines: NOT DETECTED
Barbiturates: NOT DETECTED
Benzodiazepines: NOT DETECTED
Cocaine: NOT DETECTED
Opiates: NOT DETECTED
Tetrahydrocannabinol: NOT DETECTED

## 2019-02-24 LAB — BASIC METABOLIC PANEL
Anion gap: 16 — ABNORMAL HIGH (ref 5–15)
BUN: 8 mg/dL (ref 6–20)
CO2: 21 mmol/L — ABNORMAL LOW (ref 22–32)
Calcium: 9.1 mg/dL (ref 8.9–10.3)
Chloride: 104 mmol/L (ref 98–111)
Creatinine, Ser: 1.11 mg/dL (ref 0.61–1.24)
GFR calc Af Amer: >60 mL/min (ref >60)
GFR calc non Af Amer: >60 mL/min (ref >60)
Glucose, Bld: 120 mg/dL — ABNORMAL HIGH (ref 70–99)
Potassium: 3 mmol/L — ABNORMAL LOW (ref 3.5–5.1)
Sodium: 141 mmol/L (ref 135–145)

## 2019-02-24 LAB — CREATININE, SERUM
Creatinine, Ser: 0.93 mg/dL (ref 0.61–1.24)
GFR calc Af Amer: >60 mL/min (ref >60)
GFR calc non Af Amer: >60 mL/min (ref >60)

## 2019-02-24 LAB — LIPID PANEL
Cholesterol: 250 mg/dL — ABNORMAL HIGH (ref 0–200)
HDL: 113 mg/dL (ref >40)
LDL Cholesterol: 123 mg/dL — ABNORMAL HIGH (ref 0–99)
Total CHOL/HDL Ratio: 2.2 RATIO
Triglycerides: 69 mg/dL (ref <150)
VLDL: 14 mg/dL (ref 0–40)

## 2019-02-24 LAB — ECHOCARDIOGRAM LIMITED
Height: 66 in
Weight: 3213.42 oz

## 2019-02-24 LAB — BRAIN NATRIURETIC PEPTIDE: B Natriuretic Peptide: 35 pg/mL (ref 0.0–100.0)

## 2019-02-24 LAB — TSH: TSH: 1.261 u[IU]/mL (ref 0.350–4.500)

## 2019-02-24 LAB — ETHANOL: Alcohol, Ethyl (B): 248 mg/dL — ABNORMAL HIGH (ref <10)

## 2019-02-24 LAB — HIV ANTIBODY (ROUTINE TESTING W REFLEX): HIV Screen 4th Generation wRfx: NONREACTIVE

## 2019-02-24 SURGERY — LEFT HEART CATH AND CORONARY ANGIOGRAPHY
Anesthesia: LOCAL

## 2019-02-24 MED ORDER — HEPARIN (PORCINE) 25000 UT/250ML-% IV SOLN
1150.0000 [IU]/h | INTRAVENOUS | Status: DC
  Administered 2019-02-24: 1150 [IU]/h via INTRAVENOUS
  Filled 2019-02-24: qty 250

## 2019-02-24 MED ORDER — ENOXAPARIN SODIUM 40 MG/0.4ML ~~LOC~~ SOLN: 40.0000 mg | SUBCUTANEOUS | Status: DC

## 2019-02-24 MED ORDER — ACETAMINOPHEN 325 MG PO TABS
650.0000 mg | ORAL_TABLET | ORAL | Status: DC | PRN
  Administered 2019-02-24: 650 mg via ORAL
  Filled 2019-02-24 (×2): qty 2

## 2019-02-24 MED ORDER — OXYCODONE HCL 5 MG PO TABS
5.0000 mg | ORAL_TABLET | ORAL | Status: DC | PRN
  Administered 2019-02-24 – 2019-02-25 (×3): 10 mg via ORAL
  Filled 2019-02-24 (×3): qty 2

## 2019-02-24 MED ORDER — LOSARTAN POTASSIUM 50 MG PO TABS
25.0000 mg | ORAL_TABLET | Freq: Every day | ORAL | Status: DC
  Administered 2019-02-24 – 2019-02-25 (×2): 25 mg via ORAL
  Filled 2019-02-24 (×2): qty 1

## 2019-02-24 MED ORDER — ATORVASTATIN CALCIUM 80 MG PO TABS: 80.0000 mg | ORAL_TABLET | Freq: Every day | ORAL | Status: DC

## 2019-02-24 MED ORDER — SODIUM CHLORIDE 0.9% FLUSH: 3.0000 mL | INTRAVENOUS | Status: DC | PRN

## 2019-02-24 MED ORDER — METOPROLOL TARTRATE 25 MG PO TABS
25.0000 mg | ORAL_TABLET | Freq: Two times a day (BID) | ORAL | Status: DC
  Administered 2019-02-24 – 2019-02-26 (×5): 25 mg via ORAL
  Filled 2019-02-24 (×5): qty 1

## 2019-02-24 MED ORDER — SODIUM CHLORIDE 0.9 % WEIGHT BASED INFUSION: 1.0000 mL/kg/h | INTRAVENOUS | Status: DC

## 2019-02-24 MED ORDER — HEPARIN BOLUS VIA INFUSION
4000.0000 [IU] | Freq: Once | INTRAVENOUS | Status: AC
  Administered 2019-02-24: 4000 [IU] via INTRAVENOUS
  Filled 2019-02-24: qty 4000

## 2019-02-24 MED ORDER — HEPARIN SODIUM (PORCINE) 1000 UNIT/ML IJ SOLN
INTRAMUSCULAR | Status: DC | PRN
  Administered 2019-02-24: 4500 [IU] via INTRAVENOUS

## 2019-02-24 MED ORDER — ASPIRIN 81 MG PO CHEW
CHEWABLE_TABLET | ORAL | Status: DC | PRN
  Administered 2019-02-24: 324 mg via ORAL

## 2019-02-24 MED ORDER — FENTANYL CITRATE (PF) 100 MCG/2ML IJ SOLN
INTRAMUSCULAR | Status: DC | PRN
  Administered 2019-02-24: 25 ug via INTRAVENOUS

## 2019-02-24 MED ORDER — MIDAZOLAM HCL 2 MG/2ML IJ SOLN
INTRAMUSCULAR | Status: DC | PRN
  Administered 2019-02-24: 1 mg via INTRAVENOUS

## 2019-02-24 MED ORDER — IOHEXOL 350 MG/ML SOLN
INTRAVENOUS | Status: DC | PRN
  Administered 2019-02-24: 145 mL via INTRA_ARTERIAL

## 2019-02-24 MED ORDER — HEPARIN (PORCINE) IN NACL 1000-0.9 UT/500ML-% IV SOLN
INTRAVENOUS | Status: AC
  Filled 2019-02-24: qty 500

## 2019-02-24 MED ORDER — LIDOCAINE HCL (PF) 1 % IJ SOLN
INTRAMUSCULAR | Status: AC
  Filled 2019-02-24: qty 30

## 2019-02-24 MED ORDER — NITROGLYCERIN 0.4 MG SL SUBL
0.4000 mg | SUBLINGUAL_TABLET | SUBLINGUAL | Status: DC | PRN
  Administered 2019-02-24 (×3): 0.4 mg via SUBLINGUAL
  Filled 2019-02-24 (×2): qty 1

## 2019-02-24 MED ORDER — LIDOCAINE HCL (PF) 1 % IJ SOLN
INTRAMUSCULAR | Status: DC | PRN
  Administered 2019-02-24: 2 mL

## 2019-02-24 MED ORDER — ASPIRIN 81 MG PO CHEW
81.0000 mg | CHEWABLE_TABLET | Freq: Once | ORAL | Status: AC
  Administered 2019-02-24: 81 mg via ORAL

## 2019-02-24 MED ORDER — ALUM & MAG HYDROXIDE-SIMETH 200-200-20 MG/5ML PO SUSP
30.0000 mL | Freq: Once | ORAL | Status: AC
  Administered 2019-02-24: 30 mL via ORAL
  Filled 2019-02-24: qty 30

## 2019-02-24 MED ORDER — ASPIRIN 81 MG PO CHEW
CHEWABLE_TABLET | ORAL | Status: AC
  Filled 2019-02-24: qty 4

## 2019-02-24 MED ORDER — HEPARIN SODIUM (PORCINE) 5000 UNIT/ML IJ SOLN: 5000.0000 [IU] | Freq: Three times a day (TID) | INTRAMUSCULAR | Status: DC

## 2019-02-24 MED ORDER — VERAPAMIL HCL 2.5 MG/ML IV SOLN
INTRAVENOUS | Status: DC | PRN
  Administered 2019-02-24: 10 mL via INTRA_ARTERIAL

## 2019-02-24 MED ORDER — FENTANYL CITRATE (PF) 100 MCG/2ML IJ SOLN
INTRAMUSCULAR | Status: AC
  Filled 2019-02-24: qty 2

## 2019-02-24 MED ORDER — MIDAZOLAM HCL 2 MG/2ML IJ SOLN
INTRAMUSCULAR | Status: AC
  Filled 2019-02-24: qty 2

## 2019-02-24 MED ORDER — PERFLUTREN LIPID MICROSPHERE
1.0000 mL | INTRAVENOUS | Status: AC | PRN
  Administered 2019-02-24: 13:00:00 4 mL via INTRAVENOUS
  Filled 2019-02-24: qty 10

## 2019-02-24 MED ORDER — SODIUM CHLORIDE 0.9 % IV SOLN
INTRAVENOUS | Status: AC
  Administered 2019-02-24: 12:00:00 via INTRAVENOUS

## 2019-02-24 MED ORDER — SODIUM CHLORIDE 0.9 % WEIGHT BASED INFUSION
3.0000 mL/kg/h | INTRAVENOUS | Status: AC
  Administered 2019-02-24: 10:00:00 3 mL/kg/h via INTRAVENOUS

## 2019-02-24 MED ORDER — ACETAMINOPHEN 325 MG PO TABS
650.0000 mg | ORAL_TABLET | ORAL | Status: DC | PRN
  Administered 2019-02-24: 13:00:00 650 mg via ORAL

## 2019-02-24 MED ORDER — LABETALOL HCL 5 MG/ML IV SOLN: 10.0000 mg | INTRAVENOUS | Status: AC | PRN

## 2019-02-24 MED ORDER — SODIUM CHLORIDE 0.9% FLUSH
3.0000 mL | Freq: Two times a day (BID) | INTRAVENOUS | Status: DC
  Administered 2019-02-24 – 2019-02-25 (×2): 3 mL via INTRAVENOUS

## 2019-02-24 MED ORDER — HEPARIN SODIUM (PORCINE) 1000 UNIT/ML IJ SOLN
INTRAMUSCULAR | Status: AC
  Filled 2019-02-24: qty 1

## 2019-02-24 MED ORDER — NITROGLYCERIN IN D5W 200-5 MCG/ML-% IV SOLN
0.0000 ug/min | INTRAVENOUS | Status: DC
  Administered 2019-02-24: 5 ug/min via INTRAVENOUS
  Filled 2019-02-24: qty 250

## 2019-02-24 MED ORDER — HEPARIN (PORCINE) IN NACL 1000-0.9 UT/500ML-% IV SOLN
INTRAVENOUS | Status: DC | PRN
  Administered 2019-02-24 (×2): 500 mL

## 2019-02-24 MED ORDER — SODIUM CHLORIDE 0.9 % IV SOLN: 250.0000 mL | INTRAVENOUS | Status: DC | PRN

## 2019-02-24 MED ORDER — ONDANSETRON HCL 4 MG/2ML IJ SOLN: 4.0000 mg | Freq: Four times a day (QID) | INTRAMUSCULAR | Status: DC | PRN

## 2019-02-24 MED ORDER — ATORVASTATIN CALCIUM 40 MG PO TABS
40.0000 mg | ORAL_TABLET | Freq: Every day | ORAL | Status: DC
  Administered 2019-02-24 – 2019-02-25 (×2): 40 mg via ORAL
  Filled 2019-02-24 (×2): qty 1

## 2019-02-24 MED ORDER — LIDOCAINE VISCOUS HCL 2 % MT SOLN
15.0000 mL | Freq: Once | OROMUCOSAL | Status: AC
  Administered 2019-02-24: 10:00:00 15 mL via ORAL
  Filled 2019-02-24: qty 15

## 2019-02-24 MED ORDER — HYDRALAZINE HCL 20 MG/ML IJ SOLN: 10.0000 mg | INTRAMUSCULAR | Status: AC | PRN

## 2019-02-24 MED ORDER — HEPARIN (PORCINE) 25000 UT/250ML-% IV SOLN
1500.0000 [IU]/h | INTRAVENOUS | Status: DC
  Administered 2019-02-24: 1150 [IU]/h via INTRAVENOUS
  Administered 2019-02-26: 08:00:00 1500 [IU]/h via INTRAVENOUS
  Filled 2019-02-24 (×2): qty 250

## 2019-02-24 MED ORDER — NITROGLYCERIN 0.1 MG/HR TD PT24
0.1000 mg | MEDICATED_PATCH | TRANSDERMAL | Status: DC
  Administered 2019-02-24: 14:00:00 0.1 mg via TRANSDERMAL
  Filled 2019-02-24: qty 1

## 2019-02-24 MED ORDER — ASPIRIN 81 MG PO CHEW
81.0000 mg | CHEWABLE_TABLET | Freq: Every day | ORAL | Status: DC
  Administered 2019-02-25 – 2019-02-26 (×2): 81 mg via ORAL
  Filled 2019-02-24 (×3): qty 1

## 2019-02-24 MED ORDER — POTASSIUM CHLORIDE CRYS ER 20 MEQ PO TBCR
40.0000 meq | EXTENDED_RELEASE_TABLET | ORAL | Status: AC
  Administered 2019-02-24 (×2): 40 meq via ORAL
  Filled 2019-02-24 (×2): qty 2

## 2019-02-24 MED ORDER — ASPIRIN 81 MG PO CHEW: 324.0000 mg | CHEWABLE_TABLET | Freq: Once | ORAL | Status: AC

## 2019-02-24 SURGICAL SUPPLY — 13 items
BAG SNAP BAND KOVER 36X36 (MISCELLANEOUS) ×1 IMPLANT
CATH 5FR JL3.5 JR4 ANG PIG MP (CATHETERS) ×1 IMPLANT
DEVICE RAD COMP TR BAND LRG (VASCULAR PRODUCTS) ×1 IMPLANT
GLIDESHEATH SLEND A-KIT 6F 22G (SHEATH) ×1 IMPLANT
GUIDEWIRE INQWIRE 1.5J.035X260 (WIRE) IMPLANT
INQWIRE 1.5J .035X260CM (WIRE) ×2
KIT ENCORE 26 ADVANTAGE (KITS) ×1 IMPLANT
KIT HEART LEFT (KITS) ×2 IMPLANT
PACK CARDIAC CATHETERIZATION (CUSTOM PROCEDURE TRAY) ×2 IMPLANT
SHEATH PROBE COVER 6X72 (BAG) ×1 IMPLANT
SYR MEDRAD MARK 7 150ML (SYRINGE) ×1 IMPLANT
TRANSDUCER W/STOPCOCK (MISCELLANEOUS) ×2 IMPLANT
TUBING CIL FLEX 10 FLL-RA (TUBING) ×2 IMPLANT

## 2019-02-24 NOTE — Progress Notes (Signed)
  Echocardiogram 2D Echocardiogram with Definity has been performed.  Gerda Diss 02/24/2019, 12:57 PM

## 2019-02-24 NOTE — Progress Notes (Signed)
Pt keep complaining about the chest pain, I titrated the nitro drip. We'll continue to monitor.

## 2019-02-24 NOTE — Interval H&P Note (Signed)
Cath Lab Visit (complete for each Cath Lab visit)  Clinical Evaluation Leading to the Procedure:   ACS: Yes.    Non-ACS:    Anginal Classification: CCS IV  Anti-ischemic medical therapy: No Therapy  Non-Invasive Test Results: No non-invasive testing performed  Prior CABG: No previous CABG      History and Physical Interval Note:  02/24/2019 10:24 AM  [name]  has presented today for surgery, with the diagnosis of STEMI.  The various methods of treatment have been discussed with the patient and family. After consideration of risks, benefits and other options for treatment, the patient has consented to  Procedure(s): Coronary/Graft Acute MI Revascularization (N/A) LEFT HEART CATH AND CORONARY ANGIOGRAPHY (N/A) as a surgical intervention.  The patient's history has been reviewed, patient examined, no change in status, stable for surgery.  I have reviewed the patient's chart and labs.  Questions were answered to the patient's satisfaction.     Lyn Records III
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
## 2019-02-24 NOTE — Progress Notes (Signed)
ANTICOAGULATION CONSULT NOTE -  Pharmacy Consult for IV heparin Indication: Apical thrombus  No Known Allergies  Patient Measurements: Height: 5\' 6"  (167.6 cm) Weight: 200 lb 13.4 oz (91.1 kg) IBW/kg (Calculated) : 63.8 Heparin Dosing Weight: 83.2  Vital Signs: Temp: 98.7 F (37.1 C) (04/25 0804) Temp Source: Oral (04/25 0804) BP: 164/103 (04/25 1300) Pulse Rate: 69 (04/25 1300)  Labs: Recent Labs    02/24/19 0259 02/24/19 0611 02/24/19 0955 02/24/19 1302  HGB 14.5  --   --  14.4  HCT 42.6  --   --  41.2  PLT 324  --   --  280  CREATININE 1.11  --   --   --   TROPONINI <0.03 4.07* 51.10*  --     Estimated Creatinine Clearance: 104.7 mL/min (by C-G formula based on SCr of 1.11 mg/dL).   Medical History: Past Medical History:  Diagnosis Date  . Asthma   . Smoking 1/2 pack a day or less     Medications:  Infusions:  . sodium chloride 125 mL/hr at 02/24/19 1147  . sodium chloride    . heparin      Assessment: 28 yo male admitted with chest pain, positive troponins.  Initiated this AM on IV heparin and then taken to cath lab.  Found with SCAD and heparin stopped post-procedure.  ECHO then revealed apical thrombus, so pharmacy asked to resume heparin 6 hrs after sheath pull. Sheath removed at 1110 AM.  Goal of Therapy:  Heparin level 0.3-0.7 units/ml Monitor platelets by anticoagulation protocol: Yes   Plan:  1. Start IV Heparin 1150 units/hr at 6 pm. 2. Check heparin level in 6 hrs 3. Daily heparin level and CBC. 4. F/u plans for oral anticoagulation.  Jenetta Downer, [facility] Clinical Pharmacist Phone [phone]  02/24/2019 1:53 PM

## 2019-02-24 NOTE — Progress Notes (Signed)
CRITICAL VALUE ALERT  Critical Value:  Troponin 51.10   Date & Time Notied:  02/24/19 11:30  Provider Notified: Delton See, MD  Orders Received/Actions taken: Echo lab called by Dr. Delton See for echo to be done at bedside.

## 2019-02-24 NOTE — ED Notes (Signed)
Critical Troponin reported to [employer]

## 2019-02-24 NOTE — Progress Notes (Signed)
Right radial TR band removed. Gauze dressing with tegaderm applied. Site level 0.

## 2019-02-24 NOTE — Progress Notes (Signed)
Pt arrived to 4e from ED. Pt oriented to room and staff. Vitals obtained. CHG bath completed. Telemetry box applied and CCMD notified x2 verifiers. Pt denies needs at this time.

## 2019-02-24 NOTE — Progress Notes (Addendum)
FPTS Interim Progress Note  S: nurse paged to inform of patient chest pain and SOB. Patient states that the pain started gradually about an hour ago and has since increased in severity to 10/10. The pain is improved with leaning forward. Described as a left-sided chest pressure, non-radiating. Nurse gave nitro which did improve pain somewhat. This is different than his typical GERD pain. He feels that he cannot get any air. He does have a history of sleep apnea, but does not use CPAP.  O: BP (!) 155/106 (BP Location: Left Arm)   Pulse 67   Temp 98.7 F (37.1 C) (Oral)   Resp 20   Ht 5\' 6"  (1.676 m)   Wt 91.1 kg   SpO2 99%   BMI 32.42 kg/m   Repeat ECG unchanged from previous. Patient on 2L O2 [place] and appears comfortable and drowsy. Alert and oriented.  A/P: Consulted cardiologist, will reach out to fellow to request to see patient ECG q6 hr Nitro gtt Oxycodone prn for pain Continue BB Continue arb Okay to start heparin gtt Continuous cardiac monitoring  Leeroy Bock, DO 02/24/2019, 5:07 PM PGY-1, [facility] Family Medicine Service pager [phone]
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
## 2019-02-24 NOTE — Progress Notes (Signed)
Pt arrived back to 4e from cath lab. TR band to right wrist with 13 cc of air placed at 11:10 am. Site is level 0. Vitals obtained. BP elevated. Pt will receive scheduled metoprolol. Pt sleeping at this time.

## 2019-02-24 NOTE — Progress Notes (Signed)
Called to pt's room due to pt feeling SOB and chest pressure increasing to 10/10. Cardiology paged with no response. 1 dose sublingual nitro administered. Family medicine called and arrived to bedside. EKG completed and in epic. Pt placed on 2L O2 and sat up in bed. Pt reports breathing was improving. After about 8 minutes of nitro being administered, pt reported chest pressure had decreased to 6/10.
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
## 2019-02-24 NOTE — Progress Notes (Signed)
ANTICOAGULATION CONSULT NOTE - Initial Consult  Pharmacy Consult for IV heparin Indication: chest pain/ACS  No Known Allergies  Patient Measurements: Height: 5\' 6"  (167.6 cm) Weight: 200 lb 13.4 oz (91.1 kg) IBW/kg (Calculated) : 63.8 Heparin Dosing Weight: 83.2  Vital Signs: Temp: 98.7 F (37.1 C) (04/25 0804) Temp Source: Oral (04/25 0804) BP: 128/85 (04/25 0804) Pulse Rate: 81 (04/25 0804)  Labs: Recent Labs    02/24/19 0259 02/24/19 0611  HGB 14.5  --   HCT 42.6  --   PLT 324  --   CREATININE 1.11  --   TROPONINI <0.03 4.07*    Estimated Creatinine Clearance: 104.7 mL/min (by C-G formula based on SCr of 1.11 mg/dL).   Medical History: Past Medical History:  Diagnosis Date  . Asthma   . Smoking 1/2 pack a day or less     Medications:  Infusions:  . heparin      Assessment: 28 yo male admitted with chest pain, positive troponins.  Pharmacy asked to start anticoagulation with IV heparin.  No anticoagulants noted PTA.  Goal of Therapy:  Heparin level 0.3-0.7 units/ml Monitor platelets by anticoagulation protocol: Yes   Plan:  1. Heparin 4000 unit IV bolus x 1 2. Then start IV Heparin 1150 units/hr. 3. Check heparin level in 6 hrs 4. Daily heparin level and CBC.  Jenetta Downer, [facility] Clinical Pharmacist Phone [phone]  02/24/2019 8:22 AM

## 2019-02-24 NOTE — ED Notes (Signed)
Pt c/o more centralized chest pain. Rates as 4/10. Saying feels like reflux. Repeated ECG and gave to Blinda Leatherwood, MD.
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
## 2019-02-24 NOTE — ED Notes (Signed)
Patient transported to X-ray 
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
## 2019-02-24 NOTE — Progress Notes (Signed)
Chaplain responded to [employer] on 4E.at 9:40 AM.  Chaplain waited as staff worked with pt bedside. Chaplain rec'd another page to [employer] and stepped into offer a visit at a later time.  Pt responded "What's a chaplain"" and then said yes to visit.  Chaplain returned one hour later and pt was in Cath lab. Chaplain spoke with nurse tech and asked her to ask patient when a time to visit might suit him.  Available for f/u. Lynnell Chad  [phone]

## 2019-02-24 NOTE — Progress Notes (Signed)
Cardiac Rehab Advisory Cardiac Rehab Phase I is not seeing pts face to face at this time due to Covid 19 restrictions. Ambulation is occurring through nursing, PT, and mobility teams. We will help facilitate that process as needed. We are calling pts in their rooms and discussing education. We will then deliver education materials to pts RN for delivery to pt.   Noted pt +MI/SCAD. Asked RN to order Cardiac Rehab Phase I. Gave materials to RN to give to pt including MI book, smoking cessation sheet, diet, walking guidelines, and CRPII brochure. Also gave him MI video to watch. RN will mobilize as able tomorrow and we will f/u for education Monday. I will refer to [place] CRPII.  [id number] Ethelda Chick CES, ACSM 12:35 PM 02/24/2019

## 2019-02-25 ENCOUNTER — Observation Stay (HOSPITAL_COMMUNITY): Payer: Medicaid Other

## 2019-02-25 ENCOUNTER — Encounter (HOSPITAL_COMMUNITY): Payer: Self-pay | Admitting: Family Medicine

## 2019-02-25 DIAGNOSIS — E669 Obesity, unspecified: Secondary | ICD-10-CM | POA: Diagnosis present

## 2019-02-25 DIAGNOSIS — Y908 Blood alcohol level of 240 mg/100 ml or more: Secondary | ICD-10-CM | POA: Diagnosis present

## 2019-02-25 DIAGNOSIS — R079 Chest pain, unspecified: Secondary | ICD-10-CM | POA: Diagnosis present

## 2019-02-25 DIAGNOSIS — F17210 Nicotine dependence, cigarettes, uncomplicated: Secondary | ICD-10-CM | POA: Diagnosis present

## 2019-02-25 DIAGNOSIS — I513 Intracardiac thrombosis, not elsewhere classified: Secondary | ICD-10-CM | POA: Diagnosis present

## 2019-02-25 DIAGNOSIS — Z6833 Body mass index (BMI) 33.0-33.9, adult: Secondary | ICD-10-CM | POA: Diagnosis not present

## 2019-02-25 DIAGNOSIS — R4781 Slurred speech: Secondary | ICD-10-CM | POA: Diagnosis present

## 2019-02-25 DIAGNOSIS — Z79899 Other long term (current) drug therapy: Secondary | ICD-10-CM | POA: Diagnosis not present

## 2019-02-25 DIAGNOSIS — R402362 Coma scale, best motor response, obeys commands, at arrival to emergency department: Secondary | ICD-10-CM | POA: Diagnosis present

## 2019-02-25 DIAGNOSIS — Z809 Family history of malignant neoplasm, unspecified: Secondary | ICD-10-CM | POA: Diagnosis not present

## 2019-02-25 DIAGNOSIS — Z951 Presence of aortocoronary bypass graft: Secondary | ICD-10-CM | POA: Diagnosis not present

## 2019-02-25 DIAGNOSIS — R402142 Coma scale, eyes open, spontaneous, at arrival to emergency department: Secondary | ICD-10-CM | POA: Diagnosis present

## 2019-02-25 DIAGNOSIS — I10 Essential (primary) hypertension: Secondary | ICD-10-CM | POA: Diagnosis present

## 2019-02-25 DIAGNOSIS — E785 Hyperlipidemia, unspecified: Secondary | ICD-10-CM | POA: Diagnosis present

## 2019-02-25 DIAGNOSIS — E876 Hypokalemia: Secondary | ICD-10-CM | POA: Diagnosis present

## 2019-02-25 DIAGNOSIS — I25119 Atherosclerotic heart disease of native coronary artery with unspecified angina pectoris: Secondary | ICD-10-CM | POA: Diagnosis present

## 2019-02-25 DIAGNOSIS — J45909 Unspecified asthma, uncomplicated: Secondary | ICD-10-CM | POA: Diagnosis present

## 2019-02-25 DIAGNOSIS — I2542 Coronary artery dissection: Secondary | ICD-10-CM

## 2019-02-25 DIAGNOSIS — I2119 ST elevation (STEMI) myocardial infarction involving other coronary artery of inferior wall: Secondary | ICD-10-CM | POA: Diagnosis not present

## 2019-02-25 DIAGNOSIS — Z8249 Family history of ischemic heart disease and other diseases of the circulatory system: Secondary | ICD-10-CM | POA: Diagnosis not present

## 2019-02-25 DIAGNOSIS — F10129 Alcohol abuse with intoxication, unspecified: Secondary | ICD-10-CM | POA: Diagnosis present

## 2019-02-25 DIAGNOSIS — I252 Old myocardial infarction: Secondary | ICD-10-CM | POA: Diagnosis not present

## 2019-02-25 DIAGNOSIS — K219 Gastro-esophageal reflux disease without esophagitis: Secondary | ICD-10-CM | POA: Diagnosis present

## 2019-02-25 DIAGNOSIS — Z833 Family history of diabetes mellitus: Secondary | ICD-10-CM | POA: Diagnosis not present

## 2019-02-25 DIAGNOSIS — R402252 Coma scale, best verbal response, oriented, at arrival to emergency department: Secondary | ICD-10-CM | POA: Diagnosis present

## 2019-02-25 HISTORY — DX: Coronary artery dissection: I25.42

## 2019-02-25 LAB — CBC
HCT: 43.3 % (ref 39.0–52.0)
Hemoglobin: 14.8 g/dL (ref 13.0–17.0)
MCH: 30.8 pg (ref 26.0–34.0)
MCHC: 34.2 g/dL (ref 30.0–36.0)
MCV: 90.2 fL (ref 80.0–100.0)
Platelets: 286 10*3/uL (ref 150–400)
RBC: 4.8 MIL/uL (ref 4.22–5.81)
RDW: 12.9 % (ref 11.5–15.5)
WBC: 10.2 10*3/uL (ref 4.0–10.5)
nRBC: 0 % (ref 0.0–0.2)

## 2019-02-25 LAB — HEPARIN LEVEL (UNFRACTIONATED)
Heparin Unfractionated: 0.28 IU/mL — ABNORMAL LOW (ref 0.30–0.70)
Heparin Unfractionated: 0.28 IU/mL — ABNORMAL LOW (ref 0.30–0.70)
Heparin Unfractionated: 0.37 IU/mL (ref 0.30–0.70)

## 2019-02-25 LAB — BASIC METABOLIC PANEL
Anion gap: 9 (ref 5–15)
BUN: <5 mg/dL — ABNORMAL LOW (ref 6–20)
CO2: 25 mmol/L (ref 22–32)
Calcium: 9 mg/dL (ref 8.9–10.3)
Chloride: 100 mmol/L (ref 98–111)
Creatinine, Ser: 0.95 mg/dL (ref 0.61–1.24)
GFR calc Af Amer: >60 mL/min (ref >60)
GFR calc non Af Amer: >60 mL/min (ref >60)
Glucose, Bld: 101 mg/dL — ABNORMAL HIGH (ref 70–99)
Potassium: 3.8 mmol/L (ref 3.5–5.1)
Sodium: 134 mmol/L — ABNORMAL LOW (ref 135–145)

## 2019-02-25 MED ORDER — LOSARTAN POTASSIUM 50 MG PO TABS
100.0000 mg | ORAL_TABLET | Freq: Every day | ORAL | Status: DC
  Administered 2019-02-26: 100 mg via ORAL
  Filled 2019-02-25 (×2): qty 2
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
## 2019-02-25 NOTE — Assessment & Plan Note (Signed)
Apical inferior akinesis on coronary cath 02/24/19.

## 2019-02-25 NOTE — Progress Notes (Signed)
VASCULAR LAB PRELIMINARY  PRELIMINARY  PRELIMINARY  PRELIMINARY  Carotid duplex completed.    Preliminary report:  See CV proc for preliminary results.  Hannan Tetzlaff, RVT 02/25/2019, 11:03 AM
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
## 2019-02-25 NOTE — Progress Notes (Signed)
Pt states that he does not use a CPAP at home but he feels that he does have sleep apnea, wakes up out of his sleep SOB at times he states, told patient to speak to Attending MD about a sleep apnea study. Patient is placed on CPAP, settings adjusted per patient comfort. Inline O2 bled in, humidity provided.
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
## 2019-02-25 NOTE — Progress Notes (Signed)
ANTICOAGULATION CONSULT NOTE   Pharmacy Consult for IV heparin Indication: Apical thrombus  No Known Allergies  Patient Measurements: Height: 5\' 6"  (167.6 cm) Weight: 200 lb 13.4 oz (91.1 kg) IBW/kg (Calculated) : 63.8 Heparin Dosing Weight: 83.2  Vital Signs: Temp: 98.5 F (36.9 C) (04/25 2040) Temp Source: Oral (04/25 2040) BP: 178/107 (04/25 2040) Pulse Rate: 65 (04/25 2040)  Labs: Recent Labs    02/24/19 0259  02/24/19 0955 02/24/19 1302 02/24/19 1800 02/24/19 2348 02/25/19 0003  HGB 14.5  --   --  14.4  --   --  14.8  HCT 42.6  --   --  41.2  --   --  43.3  PLT 324  --   --  280  --   --  286  HEPARINUNFRC  --   --   --   --   --  0.28*  --   CREATININE 1.11  --   --  0.93  --   --   --   TROPONINI <0.03   < > 51.10* >65.00* 63.77*  --   --    < > = values in this interval not displayed.    Estimated Creatinine Clearance: 124.9 mL/min (by C-G formula based on SCr of 0.93 mg/dL).   Medical History: Past Medical History:  Diagnosis Date  . Asthma   . Smoking 1/2 pack a day or less     Medications:  Infusions:  . sodium chloride    . heparin 1,150 Units/hr (02/24/19 1749)  . nitroGLYCERIN 10 mcg/min (02/24/19 2331)    Assessment: 28 yo male admitted with chest pain, positive troponins.  Initiated this AM on IV heparin and then taken to cath lab.  Found with SCAD and heparin stopped post-procedure.  ECHO then revealed apical thrombus, so pharmacy asked to resume heparin 6 hrs after sheath pull. Sheath removed at 1110 AM.  4/26 AM update: heparin level just below goal after re-starting heparin s/p cath for apical thrombus   Goal of Therapy:  Heparin level 0.3-0.7 units/ml Monitor platelets by anticoagulation protocol: Yes   Plan:  -Inc heparin to 1300 units/hr -Re-check heparin level in 6-8 hours  Abran Duke, PharmD, BCPS Clinical Pharmacist Phone: [phone]

## 2019-02-26 ENCOUNTER — Encounter (HOSPITAL_COMMUNITY): Payer: Self-pay | Admitting: Interventional Cardiology

## 2019-02-26 ENCOUNTER — Telehealth: Payer: Self-pay | Admitting: Physician Assistant

## 2019-02-26 ENCOUNTER — Inpatient Hospital Stay (HOSPITAL_COMMUNITY): Payer: Medicaid Other

## 2019-02-26 DIAGNOSIS — I240 Acute coronary thrombosis not resulting in myocardial infarction: Secondary | ICD-10-CM

## 2019-02-26 DIAGNOSIS — I773 Arterial fibromuscular dysplasia: Secondary | ICD-10-CM

## 2019-02-26 LAB — BASIC METABOLIC PANEL
Anion gap: 12 (ref 5–15)
BUN: 8 mg/dL (ref 6–20)
CO2: 26 mmol/L (ref 22–32)
Calcium: 8.9 mg/dL (ref 8.9–10.3)
Chloride: 97 mmol/L — ABNORMAL LOW (ref 98–111)
Creatinine, Ser: 1.17 mg/dL (ref 0.61–1.24)
GFR calc Af Amer: >60 mL/min (ref >60)
GFR calc non Af Amer: >60 mL/min (ref >60)
Glucose, Bld: 125 mg/dL — ABNORMAL HIGH (ref 70–99)
Potassium: 3.5 mmol/L (ref 3.5–5.1)
Sodium: 135 mmol/L (ref 135–145)

## 2019-02-26 LAB — CBC
HCT: 43.5 % (ref 39.0–52.0)
Hemoglobin: 14.9 g/dL (ref 13.0–17.0)
MCH: 31.2 pg (ref 26.0–34.0)
MCHC: 34.3 g/dL (ref 30.0–36.0)
MCV: 91.2 fL (ref 80.0–100.0)
Platelets: 287 10*3/uL (ref 150–400)
RBC: 4.77 MIL/uL (ref 4.22–5.81)
RDW: 12.6 % (ref 11.5–15.5)
WBC: 10.9 10*3/uL — ABNORMAL HIGH (ref 4.0–10.5)
nRBC: 0 % (ref 0.0–0.2)

## 2019-02-26 LAB — HEPARIN LEVEL (UNFRACTIONATED)
Heparin Unfractionated: 0.41 IU/mL (ref 0.30–0.70)
Heparin Unfractionated: 0.46 IU/mL (ref 0.30–0.70)

## 2019-02-26 MED ORDER — NITROGLYCERIN 0.4 MG SL SUBL: 0.4000 mg | SUBLINGUAL_TABLET | SUBLINGUAL | 0 refills | Status: DC | PRN

## 2019-02-26 MED ORDER — NICOTINE 14 MG/24HR TD PT24: 14.0000 mg | MEDICATED_PATCH | Freq: Every day | TRANSDERMAL | Status: DC | PRN

## 2019-02-26 MED ORDER — CLOPIDOGREL BISULFATE 75 MG PO TABS: 75.0000 mg | ORAL_TABLET | Freq: Every day | ORAL | 0 refills | Status: DC

## 2019-02-26 MED ORDER — LOSARTAN POTASSIUM 100 MG PO TABS: 100.0000 mg | ORAL_TABLET | Freq: Every day | ORAL | 0 refills | Status: DC

## 2019-02-26 MED ORDER — ASPIRIN 81 MG PO CHEW: 81.0000 mg | CHEWABLE_TABLET | Freq: Every day | ORAL | 0 refills | Status: DC

## 2019-02-26 MED ORDER — CLOPIDOGREL BISULFATE 75 MG PO TABS
300.0000 mg | ORAL_TABLET | Freq: Once | ORAL | Status: AC
  Administered 2019-02-26: 300 mg via ORAL
  Filled 2019-02-26: qty 4

## 2019-02-26 MED ORDER — CLOPIDOGREL BISULFATE 75 MG PO TABS: 75.0000 mg | ORAL_TABLET | Freq: Every day | ORAL | Status: DC

## 2019-02-26 MED ORDER — ATORVASTATIN CALCIUM 40 MG PO TABS: 40.0000 mg | ORAL_TABLET | Freq: Every day | ORAL | 0 refills | Status: DC

## 2019-02-26 MED ORDER — ACETAMINOPHEN 325 MG PO TABS: 650.0000 mg | ORAL_TABLET | ORAL | Status: DC | PRN

## 2019-02-26 MED ORDER — METOPROLOL TARTRATE 25 MG PO TABS: 25.0000 mg | ORAL_TABLET | Freq: Two times a day (BID) | ORAL | 0 refills | Status: DC

## 2019-02-26 MED FILL — METOPROLOL TARTRATE 25 MG T: 25 | 30 days supply | Qty: 60 | Fill #0

## 2019-02-26 MED FILL — NITROGLYCERIN 0.4 MG TAB SL: 0.4 | 8 days supply | Qty: 25 | Fill #0

## 2019-02-26 MED FILL — CLOPIDOGREL 75 MG TABLET: 75 | 30 days supply | Qty: 30 | Fill #0

## 2019-02-26 MED FILL — LOSARTAN POTASSIUM 100 MG T: 100 | 30 days supply | Qty: 30 | Fill #0

## 2019-02-26 MED FILL — ATORVASTATIN CALCIUM 40 MG: 40 | 30 days supply | Qty: 30 | Fill #0

## 2019-02-26 MED FILL — ASPIRIN LOW DOSE 81 MG CHEW: 81 | 30 days supply | Qty: 30 | Fill #0
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
## 2019-02-26 NOTE — Progress Notes (Signed)
Cardiac Rehab Advisory Cardiac Rehab Phase I is not seeing pts face to face at this time due to Covid 19 restrictions. Ambulation is occurring through nursing, PT, and mobility teams. We will help facilitate that process as needed. We are calling pts in their rooms and discussing education. We will then deliver education materials to pts RN for delivery to pt.   [id number] Called pt on phone to begin MI education. Pt had materials given on Saturday . Dicussed MI restrictions, NTG use, smoking cessation, heart healthy food choices and watching sodium, CRP 2. Pt stated he plans to quit smoking cold [place]. He has smoking cessation handout and can call 1800quitnow if needed. Referred to [place] CRP 2. Pt is walking in room. Encouraged him to walk with RN in hallway when activity  progressed by cardiology. Will give ex ed when closer to discharge. Pt voiced understanding of ed. Luetta Nutting RN BSN 02/26/2019 10:57 AM

## 2019-02-26 NOTE — Progress Notes (Signed)
Cardiac Rehab Advisory Cardiac Rehab Phase I is not seeing pts face to face at this time due to Covid 19 restrictions. Ambulation is occurring through nursing, PT, and mobility teams. We will help facilitate that process as needed. We are calling pts in their rooms and discussing education. We will then deliver education materials to pts RN for delivery to pt.   [id number] Noted pt for discharge. Called pt and completed ex ed. Understanding voiced. Luetta Nutting RN BSN 02/26/2019 3:00 PM
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
## 2019-02-26 NOTE — Progress Notes (Signed)
Pt complained of chest pressure 4/10 when moving. Nitro drip titrated as ordered. Breathing even and unlabored in 3l o2 via [place]. See MAR. Will continue to monitor.

## 2019-02-26 NOTE — Progress Notes (Signed)
Renal artery duplex has been completed. Preliminary results can be found in CV Proc through chart review.   02/26/19 9:42 AM Olen Cordial RVT

## 2019-02-26 NOTE — Progress Notes (Signed)
[facility] will sign off.   Medication Recommendations:  See today's progess note Other recommendations (labs, testing, etc):  n/A Follow up as an outpatient:  Virtual visit has been arranged.
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
## 2019-02-26 NOTE — TOC Transition Note (Signed)
Transition of Care [facility]) - CM/SW Discharge Note Donn Pierini RN, BSN Transitions of Care Unit 4E- RN Case Manager [phone]   Patient Details  Name: [name] MRN: [id number] Date of Birth: [birth year]  Transition of Care [facility]) CM/SW Contact:  Darrold Span, RN Phone Number: 02/26/2019, 3:09 PM   Clinical Narrative:    Pt admitted with STEMI, s/p cath, has been cleared for transition home today- f/u appointment has been made for [facility]- on May 6 at 8:30 - pt has been provided info for this appointment. CM spoke with pt about medications and cost through [facility] pharmacy- total cost $22 for 5 medications- pt states he can afford this and can make payment when meds delivered to bedside- [facility] pharmacy to fill and deliver to bedside prior to discharge. Pt also inquired about Medicaid - info provided for applying.    Final next level of care: Home/Self Care Barriers to Discharge: No Barriers Identified   Patient Goals and CMS Choice Patient states their goals for this hospitalization and ongoing recovery are:: "to go home, need to apply for Medicaid" CMS Medicare.gov Compare Post Acute Care list provided to:: Patient Choice offered to / list presented to : NA  Discharge Placement  Pt to d/c home with wife.                      Discharge Plan and Services In-house Referral: NA Discharge Planning Services: CM Consult, Indigent Health Clinic, Follow-up appt scheduled Post Acute Care Choice: NA          DME Arranged: N/A DME Agency: NA       HH Arranged: NA HH Agency: NA        Social Determinants of Health (SDOH) Interventions     Readmission Risk Interventions Readmission Risk Prevention Plan 02/26/2019  Post Dischage Appt Complete  Medication Screening Complete  Transportation Screening Complete  Some recent data might be hidden

## 2019-02-26 NOTE — Telephone Encounter (Signed)
   TELEPHONE CALL NOTE  This patient has been deemed a candidate for follow-up tele-health visit to limit community exposure during the Covid-19 pandemic. I spoke with the patient via phone to discuss instructions. This has been outlined on the patient's AVS (dotphrase: [email or web address]). The patient was advised to review the section on consent for treatment as well. The patient will receive a phone call 2-3 days prior to their E-Visit at which time consent will be verbally confirmed.   A Virtual Office Visit appointment type has been scheduled for hospital follow up with Chelsea Aus, with "VIDEO" or "TELEPHONE" in the appointment notes - patient prefers VIDEO  type.  I have either confirmed the patient is active in MyChart or offered to send sign-up link to phone/email via Mychart icon beside patient's photo.  [place], [place] 02/26/2019 4:05 PM

## 2019-02-26 NOTE — Discharge Instructions (Signed)
YOUR CARDIOLOGY TEAM HAS ARRANGED FOR AN E-VISIT FOR YOUR APPOINTMENT - PLEASE REVIEW IMPORTANT INFORMATION BELOW SEVERAL DAYS PRIOR TO YOUR APPOINTMENT ° °Due to the recent COVID-19 pandemic, we are transitioning in-person office visits to tele-medicine visits in an effort to decrease unnecessary exposure to our patients, their families, and staff. These visits are billed to your insurance just like a normal visit is. We also encourage you to sign up for MyChart if you have not already done so. You will need a smartphone if possible. For patients that do not have this, we can still complete the visit using a regular telephone but do prefer a smartphone to enable video when possible. You may have a family member that lives with you that can help. If possible, we also ask that you have a blood pressure cuff and scale at home to measure your blood pressure, heart rate and weight prior to your scheduled appointment. Patients with clinical needs that need an in-person evaluation and testing will still be able to come to the office if absolutely necessary. If you have any questions, feel free to call our office. ° ° ° ° °YOUR PROVIDER WILL BE USING THE FOLLOWING PLATFORM TO COMPLETE YOUR VISIT:  ° °• IF USING MYCHART - How to Download the MyChart App to Your SmartPhone  ° °- If Apple, go to App Store and type in MyChart in the search bar and download the app. If Android, ask patient to go to Google Play Store and type in MyChart in the search bar and download the app. The app is free but as with any other app downloads, your phone may require you to verify saved payment information or Apple/Android password.  °- You will need to then log into the app with your MyChart username and password, and select [facility] as your healthcare provider to link the account.  °- When it is time for your visit, go to the MyChart app, find appointments, and click Begin Video Visit. Be sure to Select Allow for your device to access the  Microphone and Camera for your visit. You will then be connected, and your provider will be with you shortly. ° **If you have any issues connecting or need assistance, please contact MyChart service desk [phone] ([phone])** ° **If using a computer, in order to ensure the best quality for your visit, you will need to use either of the following Internet Browsers: Google Chrome or Microsoft Edge** ° °• IF USING DOXIMITY or [email or web address].ME - The staff will give you instructions on receiving your link to join the meeting the day of your visit.  ° ° ° ° °2-3 DAYS BEFORE YOUR APPOINTMENT ° °You will receive a telephone call from one of our [facility] team members - your caller ID may say "Unknown caller." If this is a video visit, we will walk you through how to get the video launched on your phone. We will remind you check your blood pressure, heart rate and weight prior to your scheduled appointment. If you have an Apple Watch or Kardia, please upload any pertinent ECG strips the day before or morning of your appointment to MyChart. Our staff will also make sure you have reviewed the consent and agree to move forward with your scheduled tele-health visit.  ° ° ° °THE DAY OF YOUR APPOINTMENT ° °Approximately 15 minutes prior to your scheduled appointment, you will receive a telephone call from one of [facility] team - your caller ID may say "Unknown caller."    Our staff will confirm medications, vital signs for the day and any symptoms you may be experiencing. Please have this information available prior to the time of visit start. It may also be helpful for you to have a pad of paper and pen handy for any instructions given during your visit. They will also walk you through joining the smartphone meeting if this is a video visit.    CONSENT FOR TELE-HEALTH VISIT - PLEASE REVIEW  I hereby voluntarily request, consent and authorize [facility] and its employed or contracted physicians, physician assistants, nurse  practitioners or other licensed health care professionals (the Practitioner), to provide me with telemedicine health care services (the Services") as deemed necessary by the treating Practitioner. I acknowledge and consent to receive the Services by the Practitioner via telemedicine. I understand that the telemedicine visit will involve communicating with the Practitioner through live audiovisual communication technology and the disclosure of certain medical information by electronic transmission. I acknowledge that I have been given the opportunity to request an in-person assessment or other available alternative prior to the telemedicine visit and am voluntarily participating in the telemedicine visit.  I understand that I have the right to withhold or withdraw my consent to the use of telemedicine in the course of my care at any time, without affecting my right to future care or treatment, and that the Practitioner or I may terminate the telemedicine visit at any time. I understand that I have the right to inspect all information obtained and/or recorded in the course of the telemedicine visit and may receive copies of available information for a reasonable fee.  I understand that some of the potential risks of receiving the Services via telemedicine include:   Delay or interruption in medical evaluation due to technological equipment failure or disruption;  Information transmitted may not be sufficient (e.g. poor resolution of images) to allow for appropriate medical decision making by the Practitioner; and/or   In rare instances, security protocols could fail, causing a breach of personal health information.  Furthermore, I acknowledge that it is my responsibility to provide information about my medical history, conditions and care that is complete and accurate to the best of my ability. I acknowledge that Practitioner's advice, recommendations, and/or decision may be based on factors not within  their control, such as incomplete or inaccurate data provided by me or distortions of diagnostic images or specimens that may result from electronic transmissions. I understand that the practice of medicine is not an exact science and that Practitioner makes no warranties or guarantees regarding treatment outcomes. I acknowledge that I will receive a copy of this consent concurrently upon execution via email to the email address I last provided but may also request a printed copy by calling the office of [facility].    I understand that my insurance will be billed for this visit.   I have read or had this consent read to me.  I understand the contents of this consent, which adequately explains the benefits and risks of the Services being provided via telemedicine.   I have been provided ample opportunity to ask questions regarding this consent and the Services and have had my questions answered to my satisfaction.  I give my informed consent for the services to be provided through the use of telemedicine in my medical care  By participating in this telemedicine visit I agree to the above.  It was [facility] at [facility].  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  We started you on a blood pressure medication (losartan), cholesterol-lowering medication (atorvastatin), and an additional blood pressure lowering medication/helpful heart medication (metoprolol).  Hopefully you continue to remain chest pain-free, if you do experience recurrent severe chest pains, shortness of breath, or dizziness/lightheadedness, please let your cardiologist or primary physician know, or go to the ED/call 911.  You should hear a call from your cardiologist office in the near future to schedule follow-up appointment.  Please call the [facility] office to establish care.   Acute  Coronary Syndrome  Acute coronary syndrome (ACS) is a serious problem in which there is suddenly not enough blood and oxygen reaching the heart. ACS can result in chest pain or a heart attack. This condition is a medical emergency. If you have any symptoms of this condition, get help right away. What are the causes? This condition may be caused by:  Buildup of fat and cholesterol inside of the arteries (atherosclerosis). This is the most common cause. The buildup (plaque) can cause blood vessels in the heart (coronary arteries) to become narrow or blocked, which reduces blood flow to the heart. Plaque can also break off and lead to a clot, which can block an artery and cause a heart attack or stroke.  Sudden tightening of the muscles around the coronary arteries (coronary spasm).  Tearing of a coronary artery (spontaneous coronary artery dissection).  Very low blood pressure (hypotension).  An abnormal heartbeat (arrhythmia).  Other medical conditions that cause a decrease of oxygen to the heart, such as anemiaorrespiratory failure.  Using cocaine or methamphetamine. What increases the risk? The following factors may make you more likely to develop this condition:  Age. The risk for ACS increases as you get older.  History of chest pain, heart attack, peripheral artery disease, or stroke.  Having taken chemotherapy or immune-suppressing medicines.  Being male.  Family history of chest pain, heart disease, or stroke.  Smoking.  Not exercising enough.  Being overweight.  High cholesterol.  High blood pressure (hypertension).  Diabetes.  Excessive alcohol use. What are the signs or symptoms? Common symptoms of this condition include:  Chest pain. The pain may last a long time, or it may stop and come back (recur). It may feel like: ? Crushing or squeezing. ? Tightness, pressure, fullness, or heaviness.  Arm, neck, jaw, or back pain.  Heartburn or  indigestion.  Shortness of breath.  Nausea.  Sudden cold sweats.  Light-headedness.  Dizziness, or passing out.  Tiredness (fatigue). Sometimes there are no symptoms. How is this diagnosed? This condition may be diagnosed based on:  Your medical history and symptoms.  An electrocardiogram (ECG). This imaging test measures the heart's electrical activity.  Blood tests. Cardiac blood tests may need to be repeated at designated time intervals.  Chest X-ray.  A CT scan of the chest.  A coronary angiogram. This is a procedure in which dye is injected into the bloodstream and then X-rays are taken to show if there is a blockage in a coronary artery.  Exercise stress testing.  Echocardiography. This is a test that uses sound waves to produce detailed images of the heart. How is this treated? The treatment is to restore blood flow to the heart as soon as possible. Treatment for this condition may include:  Oxygen therapy.  Medicines, such as: ? Antiplatelet  medicines and blood-thinning medicines, such as aspirin. These help prevent blood clots. ? Medicine that dissolves any blood clots (fibrinolytic therapy). ? Blood pressure medicines. ? Nitroglycerin. This helps relieve chest pain and widens blood vessels to improve blood flow. ? Pain medicine. ? Cholesterol-lowering medicine.  Surgery, such as: ? Coronary angioplasty with stent placement. This involves placing a small piece of metal that looks like mesh or a spring into a narrow coronary artery. This widens the artery and keep it open. ? Coronary artery bypass surgery. This involves taking a section of a blood vessel from a different part of your body, and placing it on the blocked coronary artery to allow blood to flow around (bypass) the blockage.  Cardiac rehabilitation. This is a program that helps improve your health and well-being. It includes exercise training, education, and counseling to help you recover. Follow  these instructions at home: Eating and drinking  Eat a heart-healthy diet that includes whole grains, fruits and vegetables, lean proteins, and low-fat or nonfat dairy products.  Limit how much salt (sodium) you eat as told by your health care provider. Follow instructions from your health care provider about any other eating or drinking restrictions, such as limiting foods that are high in fat and processed sugars.  Use healthy cooking methods such as roasting, grilling, broiling, baking, poaching, steaming, or stir-frying.  Talk with a dietitian to learn about healthy cooking methods and how to eat less sodium. Medicines  Take over-the-counter and prescription medicines only as told by your health care provider.  Do not take these medicines unless your health care provider approves: ? Vitamin supplements that contain vitamin A or vitamin E. ? Nonsteroidal anti-inflammatory drugs (NSAIDs), such as ibuprofen, naproxen, or celecoxib. ? Hormone replacement therapy that contains estrogen. If you are taking blood thinners:  Talk with your health care provider before you take any medicines that contain aspirin or NSAIDs. These medicines increase your risk for dangerous bleeding.  Take your medicine exactly as told, at the same time every day.  Avoid activities that could cause injury or bruising, and follow instructions about how to prevent falls.  Wear a medical alert bracelet, and carry a card that lists what medicines you take. Activity  Join a cardiac rehabilitation program. An exercise plan will be developed for you.  Ask your health care provider: ? What activities and exercises are safe for you. ? If you should follow specific instructions about lifting, driving, or climbing stairs. Lifestyle  Do not use any products that contain nicotine or tobacco, such as cigarettes and e-cigarettes. If you need help quitting, ask your health care provider.  If your health care provider  says that alcohol is safe for you, limit your alcohol intake to no more than 1 drink a day. One drink equals 12 oz of beer, 5 oz of wine, or 1 oz of hard liquor.  Maintain a healthy weight. If you need to lose weight, work with your health care provider to do so safely. General instructions  Tell all the health care providers who care for you about your heart condition, including your dentist. This may affect the medicines or treatment you receive.  Manage any other health conditions you have, such as hypertension or diabetes. These conditions affect your heart.  Learn ways to manage stress.  Get screened for depression, and get mental health treatment if you need it. People with ACS are at higher risk for depression.  Keep your vaccinations up to date. Get the  flu shot (influenza vaccine) every year.  If directed, monitor your blood pressure at home.  Keep all follow-up visits as told by your health care provider. This is important. Contact a health care provider if:  You feel overwhelmed or sad.  You have trouble doing your daily activities. Get help right away if:  You have pain in your chest, neck, arm, jaw, stomach, or back that recurs, and: ? It lasts for more than a few minutes. ? It is not relieved by taking the medicineyour health care provider prescribed.  You have unexplained: ? Heavy sweating. ? Heartburn or indigestion. ? Nausea or vomiting. ? Shortness of breath. ? Difficulty breathing. ? Fatigue. ? Nervousness or anxiety. ? Weakness. ? Diarrhea. ? Dark stools or blood in your stool.  You have sudden light-headedness or dizziness.  Your blood pressure is higher than 180/120.  You faint.  You have thoughts about hurting yourself. These symptoms may represent a serious problem that is an emergency. Do not wait to see if the symptoms will go away. Get medical help right away. Call your local emergency services (911 in the [place] Do not drive yourself to the  hospital. If you ever feel like you may hurt yourself or others, or have thoughts about taking your own life, get help right away. You can go to your nearest emergency department or call:  Emergency services (911 in the [place]  A suicide crisis helpline, such as the National Suicide Prevention Lifeline at (502)056-5733. This is open 24 hours a day. Summary  Acute coronary syndrome (ACS) is when there is not enough blood and oxygen being supplied to the heart. ACS can result in chest pain or a heart attack.  Acute coronary syndrome is a medical emergency. If you have any symptoms of this condition, get help right away.  Treatment includes medicines and procedures to open the blocked arteries and restore blood flow. This information is not intended to replace advice given to you by your health care provider. Make sure you discuss any questions you have with your health care provider. Document Released: 10/18/2005 Document Revised: 06/28/2017 Document Reviewed: 06/28/2017 Elsevier Interactive Patient Education  2019 [employer].

## 2019-02-26 NOTE — Progress Notes (Signed)
D/c instructions given to patient. Medications reviewed with patients and delivered to bedside via [facility] pharmacy. IV removed, clean and intact. Wife to escort pt home.  Versie Starks, RN
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
## 2019-02-26 NOTE — Care Management (Signed)
Hospital followup appt made with [facility] on 03/07/2019 @ 8:30am. Called number listed on file for patient and msg was left with patients spouse informing of appt date/ time.
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
## 2019-03-07 ENCOUNTER — Ambulatory Visit: Payer: Self-pay | Attending: Family Medicine | Admitting: Physician Assistant

## 2019-03-07 ENCOUNTER — Other Ambulatory Visit: Payer: Self-pay

## 2019-03-07 DIAGNOSIS — I2121 ST elevation (STEMI) myocardial infarction involving left circumflex coronary artery: Secondary | ICD-10-CM

## 2019-03-07 DIAGNOSIS — Z09 Encounter for follow-up examination after completed treatment for conditions other than malignant neoplasm: Secondary | ICD-10-CM

## 2019-03-07 DIAGNOSIS — I10 Essential (primary) hypertension: Secondary | ICD-10-CM

## 2019-03-07 MED ORDER — ASPIRIN 81 MG PO CHEW: 81.0000 mg | CHEWABLE_TABLET | Freq: Every day | ORAL | 0 refills | Status: DC

## 2019-03-07 MED ORDER — ATORVASTATIN CALCIUM 40 MG PO TABS: 40.0000 mg | ORAL_TABLET | Freq: Every day | ORAL | 1 refills | Status: DC

## 2019-03-07 MED ORDER — CLOPIDOGREL BISULFATE 75 MG PO TABS: 75.0000 mg | ORAL_TABLET | Freq: Every day | ORAL | 0 refills | Status: DC

## 2019-03-07 MED ORDER — METOPROLOL TARTRATE 25 MG PO TABS: 25.0000 mg | ORAL_TABLET | Freq: Two times a day (BID) | ORAL | 1 refills | Status: DC

## 2019-03-07 MED ORDER — LOSARTAN POTASSIUM 100 MG PO TABS: 100.0000 mg | ORAL_TABLET | Freq: Every day | ORAL | 1 refills | Status: DC

## 2019-03-09 ENCOUNTER — Telehealth: Payer: Self-pay | Admitting: Physician Assistant

## 2019-03-09 NOTE — Telephone Encounter (Signed)
I have tried several times to call patient but have not been able to leave a voice message on his phone
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
## 2019-03-12 ENCOUNTER — Telehealth (INDEPENDENT_AMBULATORY_CARE_PROVIDER_SITE_OTHER): Payer: Self-pay | Admitting: Physician Assistant

## 2019-03-12 ENCOUNTER — Encounter: Payer: Self-pay | Admitting: Physician Assistant

## 2019-03-12 ENCOUNTER — Other Ambulatory Visit: Payer: Self-pay

## 2019-03-12 VITALS — BP 119/71 | HR 87 | Ht 66.0 in | Wt 205.0 lb

## 2019-03-12 DIAGNOSIS — I10 Essential (primary) hypertension: Secondary | ICD-10-CM

## 2019-03-12 DIAGNOSIS — I2510 Atherosclerotic heart disease of native coronary artery without angina pectoris: Secondary | ICD-10-CM

## 2019-03-12 DIAGNOSIS — Z7189 Other specified counseling: Secondary | ICD-10-CM

## 2019-03-12 DIAGNOSIS — Z720 Tobacco use: Secondary | ICD-10-CM

## 2019-03-12 DIAGNOSIS — E785 Hyperlipidemia, unspecified: Secondary | ICD-10-CM

## 2019-03-12 DIAGNOSIS — I513 Intracardiac thrombosis, not elsewhere classified: Secondary | ICD-10-CM

## 2019-03-12 NOTE — Patient Instructions (Signed)
Medication Instructions:  Your physician recommends that you continue on your current medications as directed. Please refer to the Current Medication list given to you today.  If you need a refill on your cardiac medications before your next appointment, please call your pharmacy.   Lab work: NONE If you have labs (blood work) drawn today and your tests are completely normal, you will receive your results only by: Marland Kitchen MyChart Message (if you have MyChart) OR . A paper copy in the mail If you have any lab test that is abnormal or we need to change your treatment, we will call you to review the results.  Testing/Procedures: NONE  Follow-Up: At [facility], you and your health needs are our priority.  As part of our continuing mission to provide you with exceptional heart care, we have created designated Provider Care Teams.  These Care Teams include your primary Cardiologist (physician) and Advanced Practice Providers (APPs -  Physician Assistants and Nurse Practitioners) who all work together to provide you with the care you need, when you need it. You will need a follow up appointment in 3-4 months.  Please call our office 2 months in advance to schedule this appointment.  You may see Dr. Delton See or one of the following Advanced Practice Providers on your designated Care Team:   [place], PA-C Ronie Spies, PA-C . Jacolyn Reedy, PA-C

## 2019-03-15 ENCOUNTER — Telehealth (HOSPITAL_COMMUNITY): Payer: Self-pay | Admitting: *Deleted

## 2019-03-15 NOTE — Telephone Encounter (Signed)
Called and left message for pt regarding general well being, cardiac rehab, exercise,heart healthy nutrition and update of medicaid application process. Phase II CR or self pay maintenance exercise program. Requested a call back. Contact information provided. Alanson Aly, BSN Cardiac and Emergency planning/management officer

## 2019-03-22 MED FILL — LOSARTAN POTASSIUM 100 MG T: 100 | 30 days supply | Qty: 30 | Fill #0

## 2019-03-22 MED FILL — CLOPIDOGREL 75 MG TABLET: 75 | 30 days supply | Qty: 30 | Fill #0

## 2019-03-22 MED FILL — ATORVASTATIN CALCIUM 40 MG: 40 | 30 days supply | Qty: 30 | Fill #0

## 2019-03-22 MED FILL — METOPROLOL TARTRATE 25 MG T: 25 | 30 days supply | Qty: 60 | Fill #0

## 2019-04-24 ENCOUNTER — Encounter (HOSPITAL_COMMUNITY): Payer: Self-pay | Admitting: Student

## 2019-04-24 ENCOUNTER — Emergency Department (HOSPITAL_COMMUNITY)
Admission: EM | Admit: 2019-04-24 | Discharge: 2019-04-24 | Disposition: A | Discharge status: Home / Self Care | Payer: Self-pay | Attending: Emergency Medicine | Admitting: Emergency Medicine

## 2019-04-24 ENCOUNTER — Other Ambulatory Visit: Payer: Self-pay

## 2019-04-24 ENCOUNTER — Emergency Department (HOSPITAL_COMMUNITY): Payer: Self-pay

## 2019-04-24 DIAGNOSIS — J45909 Unspecified asthma, uncomplicated: Secondary | ICD-10-CM | POA: Insufficient documentation

## 2019-04-24 DIAGNOSIS — R079 Chest pain, unspecified: Secondary | ICD-10-CM | POA: Insufficient documentation

## 2019-04-24 DIAGNOSIS — Z87891 Personal history of nicotine dependence: Secondary | ICD-10-CM | POA: Insufficient documentation

## 2019-04-24 DIAGNOSIS — I252 Old myocardial infarction: Secondary | ICD-10-CM | POA: Insufficient documentation

## 2019-04-24 DIAGNOSIS — Z7982 Long term (current) use of aspirin: Secondary | ICD-10-CM | POA: Insufficient documentation

## 2019-04-24 DIAGNOSIS — Z79899 Other long term (current) drug therapy: Secondary | ICD-10-CM | POA: Insufficient documentation

## 2019-04-24 DIAGNOSIS — I2121 ST elevation (STEMI) myocardial infarction involving left circumflex coronary artery: Secondary | ICD-10-CM

## 2019-04-24 DIAGNOSIS — Z951 Presence of aortocoronary bypass graft: Secondary | ICD-10-CM | POA: Insufficient documentation

## 2019-04-24 LAB — BASIC METABOLIC PANEL
Anion gap: 9 (ref 5–15)
BUN: 9 mg/dL (ref 6–20)
CO2: 24 mmol/L (ref 22–32)
Calcium: 9.1 mg/dL (ref 8.9–10.3)
Chloride: 105 mmol/L (ref 98–111)
Creatinine, Ser: 1.06 mg/dL (ref 0.61–1.24)
GFR calc Af Amer: >60 mL/min (ref >60)
GFR calc non Af Amer: >60 mL/min (ref >60)
Glucose, Bld: 106 mg/dL — ABNORMAL HIGH (ref 70–99)
Potassium: 3.6 mmol/L (ref 3.5–5.1)
Sodium: 138 mmol/L (ref 135–145)

## 2019-04-24 LAB — HEPATIC FUNCTION PANEL
ALT: 40 U/L (ref 0–44)
AST: 32 U/L (ref 15–41)
Albumin: 3.8 g/dL (ref 3.5–5.0)
Alkaline Phosphatase: 86 U/L (ref 38–126)
Bilirubin, Direct: 0.3 mg/dL — ABNORMAL HIGH (ref 0.0–0.2)
Indirect Bilirubin: 0.5 mg/dL (ref 0.3–0.9)
Total Bilirubin: 0.8 mg/dL (ref 0.3–1.2)
Total Protein: 6.8 g/dL (ref 6.5–8.1)

## 2019-04-24 LAB — CBC
HCT: 41.3 % (ref 39.0–52.0)
Hemoglobin: 14.2 g/dL (ref 13.0–17.0)
MCH: 30.8 pg (ref 26.0–34.0)
MCHC: 34.4 g/dL (ref 30.0–36.0)
MCV: 89.6 fL (ref 80.0–100.0)
Platelets: 304 10*3/uL (ref 150–400)
RBC: 4.61 MIL/uL (ref 4.22–5.81)
RDW: 12.3 % (ref 11.5–15.5)
WBC: 7.4 10*3/uL (ref 4.0–10.5)
nRBC: 0 % (ref 0.0–0.2)

## 2019-04-24 LAB — TROPONIN I (HIGH SENSITIVITY)
Troponin I (High Sensitivity): 19 ng/L — ABNORMAL HIGH (ref <18)
Troponin I (High Sensitivity): 17 ng/L (ref <18)

## 2019-04-24 LAB — LIPASE, BLOOD: Lipase: 48 U/L (ref 11–51)

## 2019-04-24 LAB — TROPONIN I: Troponin I: <0.03 ng/mL (ref <0.03)

## 2019-04-24 MED ORDER — SODIUM CHLORIDE 0.9% FLUSH
3.0000 mL | Freq: Once | INTRAVENOUS | Status: AC
  Administered 2019-04-24: 3 mL via INTRAVENOUS

## 2019-04-24 MED ORDER — METOPROLOL TARTRATE 25 MG PO TABS: 25.0000 mg | ORAL_TABLET | Freq: Two times a day (BID) | ORAL | 1 refills | Status: DC

## 2019-04-24 MED ORDER — LOSARTAN POTASSIUM 100 MG PO TABS: 100.0000 mg | ORAL_TABLET | Freq: Every day | ORAL | 1 refills | Status: DC

## 2019-04-24 MED ORDER — NITROGLYCERIN 0.4 MG SL SUBL: 0.4000 mg | SUBLINGUAL_TABLET | SUBLINGUAL | 0 refills | Status: DC | PRN

## 2019-04-24 MED ORDER — ASPIRIN 81 MG PO CHEW: 81.0000 mg | CHEWABLE_TABLET | Freq: Every day | ORAL | 0 refills | Status: DC

## 2019-04-24 MED ORDER — ATORVASTATIN CALCIUM 40 MG PO TABS: 40.0000 mg | ORAL_TABLET | Freq: Every day | ORAL | 1 refills | Status: DC

## 2019-04-24 MED ORDER — CLOPIDOGREL BISULFATE 75 MG PO TABS: 75.0000 mg | ORAL_TABLET | Freq: Every day | ORAL | 0 refills | Status: DC

## 2019-04-24 NOTE — ED Triage Notes (Signed)
Pt trnaportd from home by [facility], pt c/o chest pain similar to MI 38 days ago, pt took his own nitro at home, +shob, some relief with nitro now 7/10.  ASA 324mg . Unable to establish IV.

## 2019-04-24 NOTE — ED Provider Notes (Signed)
9:50 AM Assumed care in sign out with repeat troponin. As luck would have it, the transition to high sensitivity troponin happened in between the two blood draws. The high sensitivity troponin is 19 which is barely outside reference range of <18. Unfortunately, will have to repeat high sensitivity troponin again for relevant comparison. Third troponin normal. Unlikely ACS. Outpt FU.    Virgel Manifold, MD 04/29/19 1115

## 2019-04-24 NOTE — ED Notes (Addendum)
Patient verbalizes understanding of discharge instructions. Opportunity for questioning and answers were provided. Armband removed by staff, pt discharged from ED. Pt denied chest pain at time of d/c. Pt ambulated to lobby without assistance.
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
## 2019-04-24 NOTE — ED Notes (Signed)
IV removed from RAC; side clean, dry, intact; catheter intact

## 2019-04-27 ENCOUNTER — Telehealth (HOSPITAL_COMMUNITY): Payer: Self-pay

## 2019-04-27 NOTE — Telephone Encounter (Signed)
No response from pt, closed referral. °

## 2019-05-03 MED FILL — CLOPIDOGREL 75 MG TABLET: 75 | 30 days supply | Qty: 30 | Fill #1

## 2019-05-03 MED FILL — ?ATORVASTATIN 40MG TABLET: 40 | 30 days supply | Qty: 30 | Fill #1

## 2019-05-03 MED FILL — LOSARTAN POTASSIUM 100 MG T: 100 | 30 days supply | Qty: 30 | Fill #1

## 2019-05-03 MED FILL — NITROGLYCERIN 0.4 MG TAB SL: 0.4 | 25 days supply | Qty: 25 | Fill #0

## 2019-05-03 MED FILL — ?METOPROLOL 25 MG TABLET: 25 | 30 days supply | Qty: 60 | Fill #1

## 2019-05-23 MED FILL — ?METOPROLOL 25 MG TABLET: 25 | 30 days supply | Qty: 60 | Fill #2

## 2019-05-25 ENCOUNTER — Other Ambulatory Visit: Payer: Self-pay | Admitting: Student in an Organized Health Care Education/Training Program

## 2019-05-30 MED FILL — LOSARTAN POTASSIUM 100 MG T: 100 | 30 days supply | Qty: 30 | Fill #2

## 2019-05-30 MED FILL — ?ATORVASTATIN 40MG TABLET: 40 | 30 days supply | Qty: 30 | Fill #2

## 2019-05-30 MED FILL — CLOPIDOGREL 75 MG TABLET: 75 | 30 days supply | Qty: 30 | Fill #2

## 2019-06-15 ENCOUNTER — Ambulatory Visit: Payer: Self-pay | Admitting: Cardiology

## 2019-06-20 ENCOUNTER — Encounter: Payer: Self-pay | Admitting: Cardiology

## 2019-06-20 ENCOUNTER — Telehealth (INDEPENDENT_AMBULATORY_CARE_PROVIDER_SITE_OTHER): Payer: Self-pay | Admitting: Cardiology

## 2019-06-20 ENCOUNTER — Telehealth: Payer: Self-pay | Admitting: Cardiology

## 2019-06-20 ENCOUNTER — Other Ambulatory Visit: Payer: Self-pay

## 2019-06-20 VITALS — Ht 66.0 in | Wt 205.0 lb

## 2019-06-20 DIAGNOSIS — I5022 Chronic systolic (congestive) heart failure: Secondary | ICD-10-CM

## 2019-06-20 DIAGNOSIS — E785 Hyperlipidemia, unspecified: Secondary | ICD-10-CM

## 2019-06-20 DIAGNOSIS — I2510 Atherosclerotic heart disease of native coronary artery without angina pectoris: Secondary | ICD-10-CM

## 2019-06-20 MED ORDER — NITROGLYCERIN 0.4 MG SL SUBL: 0.4000 mg | SUBLINGUAL_TABLET | SUBLINGUAL | 3 refills | Status: DC | PRN
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
## 2019-06-20 NOTE — Telephone Encounter (Signed)
New Message    *STAT* If patient is at the pharmacy, call can be transferred to refill team.   1. Which medications need to be refilled? (please list name of each medication and dose if known) nitroGLYCERIN (NITROSTAT) 0.4 MG SL tablet  2. Which pharmacy/location (including street and city if local pharmacy) is medication to be sent to? [place], [address] [place]  3. Do they need a 30 day or 90 day supply? 90 day supply

## 2019-06-20 NOTE — Patient Instructions (Signed)
Medication Instructions:  none If you need a refill on your cardiac medications before your next appointment, please call your pharmacy.   Lab work: none If you have labs (blood work) drawn today and your tests are completely normal, you will receive your results only by: . MyChart Message (if you have MyChart) OR . A paper copy in the mail If you have any lab test that is abnormal or we need to change your treatment, we will call you to review the results.  Testing/Procedures: none  Follow-Up: 3 months with Dr Nelson At [facility], you and your health needs are our priority.  As part of our continuing mission to provide you with exceptional heart care, we have created designated Provider Care Teams.  These Care Teams include your primary Cardiologist (physician) and Advanced Practice Providers (APPs -  Physician Assistants and Nurse Practitioners) who all work together to provide you with the care you need, when you need it.  Any Other Special Instructions Will Be Listed Below (If Applicable).    

## 2019-06-21 ENCOUNTER — Other Ambulatory Visit: Payer: Self-pay | Admitting: Cardiology

## 2019-06-21 ENCOUNTER — Telehealth: Payer: Self-pay

## 2019-06-21 DIAGNOSIS — Z79899 Other long term (current) drug therapy: Secondary | ICD-10-CM

## 2019-06-21 DIAGNOSIS — E785 Hyperlipidemia, unspecified: Secondary | ICD-10-CM

## 2019-06-21 MED ORDER — NITROGLYCERIN 0.4 MG SL SUBL: 0.4000 mg | SUBLINGUAL_TABLET | SUBLINGUAL | 3 refills | Status: DC | PRN

## 2019-06-21 MED FILL — NITROGLYCERIN 0.4 MG TAB SL: 0.4 | 25 days supply | Qty: 25 | Fill #0

## 2019-06-21 NOTE — Telephone Encounter (Signed)
Pt calling asking for his medication Nitroglycerin 0.4 mg tablet to be sent to a different [facility]. This medication was prescribed dispensing 50 tablets with 3 refills. Is this a 3 month supply or a 30 day supply or did [place], [place], want to prescribe this many tablets for a as needed medication?: please address

## 2019-06-21 NOTE — Telephone Encounter (Signed)
[name], I am covering [name]'s box while she is out. I did not think NTG came in a 50 tab bottle. I would typically advise NTG 0.4mg  SL q 5 mins PRN chest pain up to 3 times, dispense #25 with 3 refills. Thanks!

## 2019-06-21 NOTE — Telephone Encounter (Signed)
Pt's medication was resent as directed per Melina Copa, PA, nitroglycerin 0.4 mg tablets dispensing 25 tablets with 3 refills. Confirmation received.

## 2019-06-21 NOTE — Telephone Encounter (Signed)
-----   Message from Consuelo Pandy, [place] sent at 06/20/2019  5:08 PM EDT ----- Regarding: Labs I forgot to tell you to order labs for this pt.   CBC - medication monitoring BMP - med monitoring FLP and HFTs- HLD  Thanks

## 2019-06-21 NOTE — Telephone Encounter (Signed)
Pt's medication was resent in per Melina Copa, PA telephone note on 06/20/19, to dispense 25 tablets with 3 refills. Confirmation received.

## 2019-06-21 NOTE — Telephone Encounter (Signed)
I spoke to the patient with [name]'s recommendation for lab work.  He will come in 8/21.

## 2019-06-22 ENCOUNTER — Other Ambulatory Visit: Payer: Self-pay

## 2019-06-22 MED FILL — ?METOPROLOL 25 MG TABLET: 25 | 30 days supply | Qty: 60 | Fill #3

## 2019-06-25 ENCOUNTER — Telehealth: Payer: Self-pay

## 2019-06-25 NOTE — Telephone Encounter (Signed)
Lpm to come in for lab work. 8/24

## 2019-06-29 ENCOUNTER — Other Ambulatory Visit: Payer: Self-pay

## 2019-06-29 ENCOUNTER — Other Ambulatory Visit: Payer: Self-pay | Admitting: *Deleted

## 2019-06-29 DIAGNOSIS — E785 Hyperlipidemia, unspecified: Secondary | ICD-10-CM

## 2019-06-29 DIAGNOSIS — Z79899 Other long term (current) drug therapy: Secondary | ICD-10-CM

## 2019-06-29 LAB — LIPID PANEL
Chol/HDL Ratio: 2 ratio (ref 0.0–5.0)
Cholesterol, Total: 167 mg/dL (ref 100–199)
HDL: 83 mg/dL (ref >39)
LDL Calculated: 68 mg/dL (ref 0–99)
Triglycerides: 80 mg/dL (ref 0–149)
VLDL Cholesterol Cal: 16 mg/dL (ref 5–40)

## 2019-06-29 LAB — HEPATIC FUNCTION PANEL
ALT: 63 IU/L — ABNORMAL HIGH (ref 0–44)
AST: 84 IU/L — ABNORMAL HIGH (ref 0–40)
Albumin: 4.8 g/dL (ref 4.1–5.2)
Alkaline Phosphatase: 96 IU/L (ref 39–117)
Bilirubin Total: 0.9 mg/dL (ref 0.0–1.2)
Bilirubin, Direct: 0.28 mg/dL (ref 0.00–0.40)
Total Protein: 7.5 g/dL (ref 6.0–8.5)

## 2019-06-29 LAB — BASIC METABOLIC PANEL
BUN/Creatinine Ratio: 13 (ref 9–20)
BUN: 14 mg/dL (ref 6–20)
CO2: 22 mmol/L (ref 20–29)
Calcium: 9.5 mg/dL (ref 8.7–10.2)
Chloride: 100 mmol/L (ref 96–106)
Creatinine, Ser: 1.06 mg/dL (ref 0.76–1.27)
GFR calc Af Amer: 109 mL/min/{1.73_m2} (ref >59)
GFR calc non Af Amer: 94 mL/min/{1.73_m2} (ref >59)
Glucose: 96 mg/dL (ref 65–99)
Potassium: 3.9 mmol/L (ref 3.5–5.2)
Sodium: 139 mmol/L (ref 134–144)

## 2019-06-29 LAB — CBC
Hematocrit: 46.7 % (ref 37.5–51.0)
Hemoglobin: 15.5 g/dL (ref 13.0–17.7)
MCH: 30 pg (ref 26.6–33.0)
MCHC: 33.2 g/dL (ref 31.5–35.7)
MCV: 90 fL (ref 79–97)
Platelets: 285 10*3/uL (ref 150–450)
RBC: 5.17 x10E6/uL (ref 4.14–5.80)
RDW: 13.5 % (ref 11.6–15.4)
WBC: 7 10*3/uL (ref 3.4–10.8)

## 2019-07-03 ENCOUNTER — Telehealth: Payer: Self-pay

## 2019-07-03 ENCOUNTER — Other Ambulatory Visit: Payer: Self-pay | Admitting: Physician Assistant

## 2019-07-03 DIAGNOSIS — I2121 ST elevation (STEMI) myocardial infarction involving left circumflex coronary artery: Secondary | ICD-10-CM

## 2019-07-03 MED FILL — ?ATORVASTATIN 40MG TABLET: 40 | 30 days supply | Qty: 30 | Fill #3

## 2019-07-03 MED FILL — LOSARTAN POTASSIUM 100 MG T: 100 | 30 days supply | Qty: 30 | Fill #3

## 2019-07-03 NOTE — Telephone Encounter (Signed)
Notes recorded by Frederik Schmidt, RN on 07/03/2019 at 8:37 AM EDT  Lpm with results. 9/1  ------

## 2019-07-03 NOTE — Telephone Encounter (Signed)
-----   Message from Consuelo Pandy, [place] sent at 07/02/2019 11:12 AM EDT ----- Labs are stable on current medications. Continue to take as directed. No changes recommended at this time.

## 2019-07-04 MED FILL — CLOPIDOGREL 75 MG TABLET: 75 | 30 days supply | Qty: 30 | Fill #0

## 2019-07-31 MED FILL — NITROGLYCERIN 0.4 MG TAB SL: 0.4 | 25 days supply | Qty: 25 | Fill #1

## 2019-07-31 MED FILL — ?ATORVASTATIN 40MG TABLET: 40 | 30 days supply | Qty: 30 | Fill #4

## 2019-07-31 MED FILL — CLOPIDOGREL 75 MG TABLET: 75 | 30 days supply | Qty: 30 | Fill #1

## 2019-07-31 MED FILL — LOSARTAN POTASSIUM 100 MG T: 100 | 30 days supply | Qty: 30 | Fill #4

## 2019-07-31 MED FILL — ?METOPROLOL 25 MG TABLET: 25 | 30 days supply | Qty: 60 | Fill #4

## 2019-09-18 MED FILL — ?ATORVASTATIN 40MG TABLET: 40 | 30 days supply | Qty: 30 | Fill #5

## 2019-09-18 MED FILL — ?METOPROLOL 25 MG TABLET: 25 | 30 days supply | Qty: 60 | Fill #5

## 2019-09-18 MED FILL — LOSARTAN POTASSIUM 100 MG T: 100 | 30 days supply | Qty: 30 | Fill #5

## 2019-09-18 MED FILL — NITROGLYCERIN 0.4 MG TAB SL: 0.4 | 25 days supply | Qty: 25 | Fill #2

## 2019-09-18 MED FILL — CLOPIDOGREL 75 MG TABLET: 75 | 30 days supply | Qty: 30 | Fill #2

## 2019-10-01 ENCOUNTER — Encounter

## 2019-10-22 ENCOUNTER — Other Ambulatory Visit: Payer: Self-pay | Admitting: Physician Assistant

## 2019-10-22 ENCOUNTER — Other Ambulatory Visit: Payer: Self-pay | Admitting: Family Medicine

## 2019-10-22 DIAGNOSIS — I2121 ST elevation (STEMI) myocardial infarction involving left circumflex coronary artery: Secondary | ICD-10-CM

## 2019-10-22 MED FILL — NITROGLYCERIN 0.4 MG TAB SL: 0.4 | 25 days supply | Qty: 25 | Fill #3

## 2019-10-23 ENCOUNTER — Other Ambulatory Visit: Payer: Self-pay | Admitting: Family Medicine

## 2019-10-23 ENCOUNTER — Other Ambulatory Visit: Payer: Self-pay | Admitting: Physician Assistant

## 2019-10-23 DIAGNOSIS — I2121 ST elevation (STEMI) myocardial infarction involving left circumflex coronary artery: Secondary | ICD-10-CM

## 2019-10-29 ENCOUNTER — Encounter: Payer: Self-pay | Admitting: Cardiology

## 2019-10-29 ENCOUNTER — Telehealth: Payer: Self-pay | Admitting: *Deleted

## 2019-10-29 ENCOUNTER — Ambulatory Visit (INDEPENDENT_AMBULATORY_CARE_PROVIDER_SITE_OTHER): Payer: Self-pay | Admitting: Cardiology

## 2019-10-29 ENCOUNTER — Encounter: Payer: Self-pay | Admitting: *Deleted

## 2019-10-29 ENCOUNTER — Other Ambulatory Visit: Payer: Self-pay

## 2019-10-29 VITALS — BP 144/84 | HR 73 | Ht 66.0 in | Wt 208.0 lb

## 2019-10-29 DIAGNOSIS — I2121 ST elevation (STEMI) myocardial infarction involving left circumflex coronary artery: Secondary | ICD-10-CM

## 2019-10-29 DIAGNOSIS — I10 Essential (primary) hypertension: Secondary | ICD-10-CM

## 2019-10-29 DIAGNOSIS — I5022 Chronic systolic (congestive) heart failure: Secondary | ICD-10-CM

## 2019-10-29 DIAGNOSIS — I513 Intracardiac thrombosis, not elsewhere classified: Secondary | ICD-10-CM

## 2019-10-29 DIAGNOSIS — Z720 Tobacco use: Secondary | ICD-10-CM

## 2019-10-29 DIAGNOSIS — I240 Acute coronary thrombosis not resulting in myocardial infarction: Secondary | ICD-10-CM

## 2019-10-29 DIAGNOSIS — I2542 Coronary artery dissection: Secondary | ICD-10-CM

## 2019-10-29 DIAGNOSIS — E785 Hyperlipidemia, unspecified: Secondary | ICD-10-CM

## 2019-10-29 DIAGNOSIS — R072 Precordial pain: Secondary | ICD-10-CM

## 2019-10-29 LAB — COMPREHENSIVE METABOLIC PANEL
ALT: 37 IU/L (ref 0–44)
AST: 30 IU/L (ref 0–40)
Albumin/Globulin Ratio: 2 (ref 1.2–2.2)
Albumin: 4.7 g/dL (ref 4.1–5.2)
Alkaline Phosphatase: 92 IU/L (ref 39–117)
BUN/Creatinine Ratio: 8 — ABNORMAL LOW (ref 9–20)
BUN: 8 mg/dL (ref 6–20)
Bilirubin Total: 0.2 mg/dL (ref 0.0–1.2)
CO2: 21 mmol/L (ref 20–29)
Calcium: 9 mg/dL (ref 8.7–10.2)
Chloride: 105 mmol/L (ref 96–106)
Creatinine, Ser: 0.96 mg/dL (ref 0.76–1.27)
GFR calc Af Amer: 123 mL/min/{1.73_m2} (ref >59)
GFR calc non Af Amer: 106 mL/min/{1.73_m2} (ref >59)
Globulin, Total: 2.4 g/dL (ref 1.5–4.5)
Glucose: 86 mg/dL (ref 65–99)
Potassium: 3.7 mmol/L (ref 3.5–5.2)
Sodium: 142 mmol/L (ref 134–144)
Total Protein: 7.1 g/dL (ref 6.0–8.5)

## 2019-10-29 LAB — LIPID PANEL
Chol/HDL Ratio: 2 ratio (ref 0.0–5.0)
Cholesterol, Total: 169 mg/dL (ref 100–199)
HDL: 86 mg/dL (ref >39)
LDL Chol Calc (NIH): 62 mg/dL (ref 0–99)
Triglycerides: 125 mg/dL (ref 0–149)
VLDL Cholesterol Cal: 21 mg/dL (ref 5–40)

## 2019-10-29 LAB — TSH: TSH: 2.05 u[IU]/mL (ref 0.450–4.500)

## 2019-10-29 MED ORDER — METOPROLOL TARTRATE 25 MG PO TABS: 25.0000 mg | ORAL_TABLET | Freq: Two times a day (BID) | ORAL | 1 refills | Status: DC

## 2019-10-29 MED ORDER — LOSARTAN POTASSIUM 100 MG PO TABS: 100.0000 mg | ORAL_TABLET | Freq: Every day | ORAL | 1 refills | Status: DC

## 2019-10-29 MED ORDER — ATORVASTATIN CALCIUM 40 MG PO TABS: 40.0000 mg | ORAL_TABLET | Freq: Every day | ORAL | 1 refills | Status: DC

## 2019-10-29 MED ORDER — CLOPIDOGREL BISULFATE 75 MG PO TABS: 75.0000 mg | ORAL_TABLET | Freq: Every day | ORAL | 4 refills | Status: DC

## 2019-10-29 NOTE — Telephone Encounter (Signed)
-----   Message from Nuala Alpha, LPN sent at [phone]  3:54 PM EST -----  ----- Message ----- From: Dorothy Spark, MD Sent: 10/29/2019   3:46 PM EST To: Nuala Alpha, LPN  Normal kidney and liver function, normal lipids, normal thyroid function.

## 2019-10-29 NOTE — Telephone Encounter (Signed)
Pt has been notified of lab results by phone with verbal understanding. Pt did also give verbal permission ok to s/w his wife Reashelle Nop. I did inform the pt to still fill out a DPR so that we may have it scanned into his chart as well. Pt is agreeable to plan of care. Pt thanked me for the call. I have noted FYI ok to s/w pt's wife Reashelle Sandquist. Patient notified of result.  Please refer to phone note from today for complete details.   Julaine Hua, [facility] 10/29/2019 4:38 PM

## 2019-10-29 NOTE — Patient Instructions (Signed)
Medication Instructions:   Your physician recommends that you continue on your current medications as directed. Please refer to the Current Medication list given to you today.  *If you need a refill on your cardiac medications before your next appointment, please call your pharmacy*   Lab Work:  TODAY--CMET, TSH, AND LIPIDS  If you have labs (blood work) drawn today and your tests are completely normal, you will receive your results only by: Marland Kitchen MyChart Message (if you have MyChart) OR . A paper copy in the mail If you have any lab test that is abnormal or we need to change your treatment, we will call you to review the results.   Testing/Procedures:  Your physician has requested that you have an echocardiogram. Echocardiography is a painless test that uses sound waves to create images of your heart. It provides your doctor with information about the size and shape of your heart and how well your heart's chambers and valves are working. This procedure takes approximately one hour. There are no restrictions for this procedure.   Your physician has requested that you have a lexiscan myoview. For further information please visit [email or web address]. Please follow instruction sheet, as given. DO ON D-SPECT PER DR. Meda Coffee    Follow-Up: At [facility], you and your health needs are our priority.  As part of our continuing mission to provide you with exceptional heart care, we have created designated Provider Care Teams.  These Care Teams include your primary Cardiologist (physician) and Advanced Practice Providers (APPs -  Physician Assistants and Nurse Practitioners) who all work together to provide you with the care you need, when you need it.  Your next appointment:   3 month(s)  The format for your next appointment:   In Person  Provider:   Ena Dawley, MD
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
## 2019-10-30 MED FILL — LOSARTAN POTASSIUM 100 MG T: 100 | 30 days supply | Qty: 30 | Fill #0

## 2019-10-30 MED FILL — ?METOPROLOL 25 MG TABLET: 25 | 30 days supply | Qty: 60 | Fill #0

## 2019-10-30 MED FILL — ?ATORVASTATIN 40MG TABLET: 40 | 30 days supply | Qty: 30 | Fill #0

## 2019-10-30 MED FILL — CLOPIDOGREL 75 MG TABLET: 75 | 30 days supply | Qty: 30 | Fill #0

## 2019-11-05 ENCOUNTER — Telehealth (HOSPITAL_COMMUNITY): Payer: Self-pay | Admitting: *Deleted

## 2019-11-05 NOTE — Telephone Encounter (Signed)
Patient given detailed instructions per Myocardial Perfusion Study Information Sheet for the test on 11/06/18. Patient notified to arrive 15 minutes early and that it is imperative to arrive on time for appointment to keep from having the test rescheduled.  If you need to cancel or reschedule your appointment, please call the office within 24 hours of your appointment. . Patient verbalized understanding. Shameeka Silliman Jacqueline    

## 2019-11-07 ENCOUNTER — Ambulatory Visit (HOSPITAL_COMMUNITY): Payer: Self-pay | Attending: Cardiovascular Disease

## 2019-11-07 ENCOUNTER — Ambulatory Visit (HOSPITAL_BASED_OUTPATIENT_CLINIC_OR_DEPARTMENT_OTHER): Payer: Self-pay

## 2019-11-07 ENCOUNTER — Other Ambulatory Visit: Payer: Self-pay

## 2019-11-07 DIAGNOSIS — I2121 ST elevation (STEMI) myocardial infarction involving left circumflex coronary artery: Secondary | ICD-10-CM | POA: Insufficient documentation

## 2019-11-07 DIAGNOSIS — I2542 Coronary artery dissection: Secondary | ICD-10-CM

## 2019-11-07 DIAGNOSIS — R072 Precordial pain: Secondary | ICD-10-CM | POA: Insufficient documentation

## 2019-11-07 DIAGNOSIS — I240 Acute coronary thrombosis not resulting in myocardial infarction: Secondary | ICD-10-CM | POA: Insufficient documentation

## 2019-11-07 LAB — MYOCARDIAL PERFUSION IMAGING
LV dias vol: 152 mL (ref 62–150)
LV sys vol: 72 mL
Peak HR: 112 {beats}/min
Rest HR: 73 {beats}/min
SDS: 4
SRS: 8
SSS: 10
TID: 1.09

## 2019-11-07 MED ORDER — TECHNETIUM TC 99M TETROFOSMIN IV KIT
31.3000 | PACK | Freq: Once | INTRAVENOUS | Status: AC | PRN
  Administered 2019-11-07: 31.3 via INTRAVENOUS
  Filled 2019-11-07: qty 32

## 2019-11-07 MED ORDER — PERFLUTREN LIPID MICROSPHERE
1.0000 mL | INTRAVENOUS | Status: AC | PRN
  Administered 2019-11-07: 09:00:00 2 mL via INTRAVENOUS

## 2019-11-07 MED ORDER — REGADENOSON 0.4 MG/5ML IV SOLN
0.4000 mg | Freq: Once | INTRAVENOUS | Status: AC
  Administered 2019-11-07: 0.4 mg via INTRAVENOUS

## 2019-11-07 MED ORDER — TECHNETIUM TC 99M TETROFOSMIN IV KIT
11.0000 | PACK | Freq: Once | INTRAVENOUS | Status: AC | PRN
  Administered 2019-11-07: 11 via INTRAVENOUS
  Filled 2019-11-07: qty 11

## 2019-11-08 ENCOUNTER — Encounter: Payer: Self-pay | Admitting: Family Medicine

## 2019-11-08 ENCOUNTER — Ambulatory Visit: Payer: Self-pay | Attending: Family Medicine | Admitting: Family Medicine

## 2019-11-08 VITALS — BP 157/102 | HR 69 | Temp 97.9°F | Ht 67.0 in | Wt 209.6 lb

## 2019-11-08 DIAGNOSIS — I10 Essential (primary) hypertension: Secondary | ICD-10-CM

## 2019-11-08 DIAGNOSIS — I240 Acute coronary thrombosis not resulting in myocardial infarction: Secondary | ICD-10-CM

## 2019-11-08 DIAGNOSIS — I2542 Coronary artery dissection: Secondary | ICD-10-CM

## 2019-11-08 DIAGNOSIS — Z79899 Other long term (current) drug therapy: Secondary | ICD-10-CM

## 2019-11-08 DIAGNOSIS — R072 Precordial pain: Secondary | ICD-10-CM

## 2019-11-08 DIAGNOSIS — I2121 ST elevation (STEMI) myocardial infarction involving left circumflex coronary artery: Secondary | ICD-10-CM

## 2019-11-08 DIAGNOSIS — I25118 Atherosclerotic heart disease of native coronary artery with other forms of angina pectoris: Secondary | ICD-10-CM

## 2019-11-08 MED ORDER — METOPROLOL TARTRATE 12.5 MG HALF TABLET
50.0000 mg | ORAL_TABLET | Freq: Two times a day (BID) | ORAL | Status: AC
  Administered 2019-11-08: 10:00:00 50 mg via ORAL

## 2019-11-08 MED ORDER — CLOPIDOGREL BISULFATE 75 MG PO TABS: 75.0000 mg | ORAL_TABLET | Freq: Every day | ORAL | 4 refills | Status: DC

## 2019-11-08 MED ORDER — METOPROLOL TARTRATE 25 MG PO TABS: 25.0000 mg | ORAL_TABLET | Freq: Two times a day (BID) | ORAL | 1 refills | Status: DC

## 2019-11-08 MED ORDER — LOSARTAN POTASSIUM 100 MG PO TABS: 100.0000 mg | ORAL_TABLET | Freq: Every day | ORAL | 1 refills | Status: DC

## 2019-11-08 MED ORDER — ASPIRIN 81 MG PO CHEW: 81.0000 mg | CHEWABLE_TABLET | Freq: Every day | ORAL | 0 refills | Status: DC

## 2019-11-08 MED ORDER — NITROGLYCERIN 0.4 MG SL SUBL: 0.4000 mg | SUBLINGUAL_TABLET | SUBLINGUAL | 3 refills | Status: DC | PRN

## 2019-11-08 MED ORDER — ATORVASTATIN CALCIUM 40 MG PO TABS: 40.0000 mg | ORAL_TABLET | Freq: Every day | ORAL | 1 refills | Status: DC
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
## 2019-11-08 NOTE — Patient Instructions (Addendum)
It is very important that you take all of your prescribed medications every day in order to avoid additional damage to your heart or another heart attack   Coronary Artery Disease, Male Coronary artery disease (CAD) is a condition in which the arteries that lead to the heart (coronary arteries) become narrow or blocked. The narrowing or blockage can lead to decreased blood flow to the heart. Prolonged reduced blood flow can cause a heart attack (myocardial infarction or MI). This condition may also be called coronary heart disease. Because CAD is the leading cause of death in men, it is important to understand what causes this condition and how it is treated. What are the causes? CAD is most often caused by atherosclerosis. This is the buildup of fat and cholesterol (plaque) on the inside of the arteries. Over time, the plaque may narrow or block the artery, reducing blood flow to the heart. Plaque can also become weak and break off within a coronary artery and cause a sudden blockage. Other less common causes of CAD include:  A blood clot or a piece of a blood clot or other substance that blocks the flow of blood in a coronary artery (embolism).  A tearing of the artery (spontaneous coronary artery dissection).  An enlargement of an artery (aneurysm).  Inflammation (vasculitis) in the artery wall. What increases the risk? The following factors may make you more likely to develop this condition:  Age. Men over age 58 are at a greater risk of CAD.  Family history of CAD.  Gender. Men often develop CAD earlier in life than women.  High blood pressure (hypertension).  Diabetes.  High cholesterol levels.  Tobacco use.  Excessive alcohol use.  Lack of exercise.  A diet high in saturated and trans fats, such as fried food and processed meat. Other possible risk factors include:  High stress levels.  Depression.  Obesity.  Sleep apnea. What are the signs or symptoms? Many  people do not have any symptoms during the early stages of CAD. As the condition progresses, symptoms may include:  Chest pain (angina). The pain can: ? Feel like crushing or squeezing, or like a tightness, pressure, fullness, or heaviness in the chest. ? Last more than a few minutes or can stop and recur. The pain tends to get worse with exercise or stress and to fade with rest.  Pain in the arms, neck, jaw, ear, or back.  Unexplained heartburn or indigestion.  Shortness of breath.  Nausea or vomiting.  Sudden light-headedness.  Sudden cold sweats.  Fluttering or fast heartbeat (palpitations). How is this diagnosed? This condition is diagnosed based on:  Your family and medical history.  A physical exam.  Tests, including: ? A test to check the electrical signals in your heart (electrocardiogram). ? Exercise stress test. This looks for signs of blockage when the heart is stressed with exercise, such as running on a treadmill. ? Pharmacologic stress test. This test looks for signs of blockage when the heart is being stressed with a medicine. ? Blood tests. ? Coronary angiogram. This is a procedure to look at the coronary arteries to see if there is any blockage. During this test, a dye is injected into your arteries so they appear on an X-ray. ? Coronary artery CT scan. This CT scan helps detect calcium deposits in your coronary arteries. Calcium deposits are an indicator of CAD. ? A test that uses sound waves to take a picture of your heart (echocardiogram). ? Chest X-ray.  How is this treated? This condition may be treated by:  Healthy lifestyle changes to reduce risk factors.  Medicines such as: ? Antiplatelet medicines and blood-thinning medicines, such as aspirin. These help to prevent blood clots. ? Nitroglycerin. ? Blood pressure medicines. ? Cholesterol-lowering medicine.  Coronary angioplasty and stenting. During this procedure, a thin, flexible tube is inserted  through a blood vessel and into a blocked artery. A balloon or similar device on the end of the tube is inflated to open up the artery. In some cases, a small, mesh tube (stent) is inserted into the artery to keep it open.  Coronary artery bypass surgery. During this surgery, veins or arteries from other parts of the body are used to create a bypass around the blockage and allow blood to reach your heart. Follow these instructions at home: Medicines  Take over-the-counter and prescription medicines only as told by your health care provider.  Do not take the following medicines unless your health care provider approves: ? NSAIDs, such as ibuprofen, naproxen, or celecoxib. ? Vitamin supplements that contain vitamin A, vitamin E, or both. Lifestyle  Follow an exercise program approved by your health care provider. Aim for 150 minutes of moderate exercise or 75 minutes of vigorous exercise each week.  Maintain a healthy weight or lose weight as approved by your health care provider.  Learn to manage stress or try to limit your stress. Ask your health care provider for suggestions if you need help.  Get screened for depression and seek treatment, if needed.  Do not use any products that contain nicotine or tobacco, such as cigarettes, e-cigarettes, and chewing tobacco. If you need help quitting, ask your health care provider.  Do not use illegal drugs. Eating and drinking   Follow a heart-healthy diet. A dietitian can help educate you about healthy food options and changes. In general, eat plenty of fruits and vegetables, lean meats, and whole grains.  Avoid foods high in: ? Sugar. ? Salt (sodium). ? Saturated fat, such as processed or fatty meat. ? Trans fat, such as fried foods.  Use healthy cooking methods such as roasting, grilling, broiling, baking, poaching, steaming, or stir-frying.  Do not drink alcohol if your health care provider tells you not to drink.  If you drink  alcohol: ? Limit how much you have to 0-2 drinks per day. ? Be aware of how much alcohol is in your drink. In the [place] one drink equals one 12 oz bottle of beer (355 mL), one 5 oz glass of wine (148 mL), or one 1 oz glass of hard liquor (44 mL). General instructions  Manage any other health conditions, such as hypertension and diabetes. These conditions affect your heart.  Your health care provider may ask you to monitor your blood pressure. Ideally, your blood pressure should be below 130/80.  Keep all follow-up visits as told by your health care provider. This is important. Get help right away if:  You have pain in your chest, neck, ear, arm, jaw, stomach, or back that: ? Lasts more than a few minutes. ? Is recurring. ? Is not relieved by taking medicine under your tongue (sublingual nitroglycerin).  You have profuse sweating without cause.  You have unexplained: ? Heartburn or indigestion. ? Shortness of breath or difficulty breathing. ? Fluttering or fast heartbeat (palpitations). ? Nausea or vomiting. ? Fatigue. ? Feelings of nervousness or anxiety. ? Weakness. ? Diarrhea.  You have sudden light-headedness or dizziness.  You faint.  You  feel like hurting yourself or think about taking your own life. These symptoms may represent a serious problem that is an emergency. Do not wait to see if the symptoms will go away. Get medical help right away. Call your local emergency services (911 in the [place] Do not drive yourself to the hospital. Summary  Coronary artery disease (CAD) is a condition in which the arteries that lead to the heart (coronary arteries) become narrow or blocked. The narrowing or blockage can lead to a heart attack.  Many people do not have any symptoms during the early stages of CAD.  CAD can be treated with lifestyle changes, medicines, surgery, or a combination of these treatments. This information is not intended to replace advice given to you by  your health care provider. Make sure you discuss any questions you have with your health care provider. Document Revised: 07/07/2018 Document Reviewed: 06/27/2018 Elsevier Patient Education  2020 [employer].  Hypertension, Adult Hypertension is another name for high blood pressure. High blood pressure forces your heart to work harder to pump blood. This can cause problems over time. There are two numbers in a blood pressure reading. There is a top number (systolic) over a bottom number (diastolic). It is best to have a blood pressure that is below 120/80. Healthy choices can help lower your blood pressure, or you may need medicine to help lower it. What are the causes? The cause of this condition is not known. Some conditions may be related to high blood pressure. What increases the risk?  Smoking.  Having type 2 diabetes mellitus, high cholesterol, or both.  Not getting enough exercise or physical activity.  Being overweight.  Having too much fat, sugar, calories, or salt (sodium) in your diet.  Drinking too much alcohol.  Having long-term (chronic) kidney disease.  Having a family history of high blood pressure.  Age. Risk increases with age.  Race. You may be at higher risk if you are African American.  Gender. Men are at higher risk than women before age 30. After age 59, women are at higher risk than men.  Having obstructive sleep apnea.  Stress. What are the signs or symptoms?  High blood pressure may not cause symptoms. Very high blood pressure (hypertensive crisis) may cause: ? Headache. ? Feelings of worry or nervousness (anxiety). ? Shortness of breath. ? Nosebleed. ? A feeling of being sick to your stomach (nausea). ? Throwing up (vomiting). ? Changes in how you see. ? Very bad chest pain. ? Seizures. How is this treated?  This condition is treated by making healthy lifestyle changes, such as: ? Eating healthy foods. ? Exercising more. ? Drinking  less alcohol.  Your health care provider may prescribe medicine if lifestyle changes are not enough to get your blood pressure under control, and if: ? Your top number is above 130. ? Your bottom number is above 80.  Your personal target blood pressure may vary. Follow these instructions at home: Eating and drinking   If told, follow the DASH eating plan. To follow this plan: ? Fill one half of your plate at each meal with fruits and vegetables. ? Fill one fourth of your plate at each meal with whole grains. Whole grains include whole-wheat pasta, brown rice, and whole-grain bread. ? Eat or drink low-fat dairy products, such as skim milk or low-fat yogurt. ? Fill one fourth of your plate at each meal with low-fat (lean) proteins. Low-fat proteins include fish, chicken without skin, eggs, beans,  and tofu. ? Avoid fatty meat, cured and processed meat, or chicken with skin. ? Avoid pre-made or processed food.  Eat less than 1,500 mg of salt each day.  Do not drink alcohol if: ? Your doctor tells you not to drink. ? You are pregnant, may be pregnant, or are planning to become pregnant.  If you drink alcohol: ? Limit how much you use to:  0-1 drink a day for women.  0-2 drinks a day for men. ? Be aware of how much alcohol is in your drink. In the [place] one drink equals one 12 oz bottle of beer (355 mL), one 5 oz glass of wine (148 mL), or one 1 oz glass of hard liquor (44 mL). Lifestyle   Work with your doctor to stay at a healthy weight or to lose weight. Ask your doctor what the best weight is for you.  Get at least 30 minutes of exercise most days of the week. This may include walking, swimming, or biking.  Get at least 30 minutes of exercise that strengthens your muscles (resistance exercise) at least 3 days a week. This may include lifting weights or doing Pilates.  Do not use any products that contain nicotine or tobacco, such as cigarettes, e-cigarettes, and chewing  tobacco. If you need help quitting, ask your doctor.  Check your blood pressure at home as told by your doctor.  Keep all follow-up visits as told by your doctor. This is important. Medicines  Take over-the-counter and prescription medicines only as told by your doctor. Follow directions carefully.  Do not skip doses of blood pressure medicine. The medicine does not work as well if you skip doses. Skipping doses also puts you at risk for problems.  Ask your doctor about side effects or reactions to medicines that you should watch for. Contact a doctor if you:  Think you are having a reaction to the medicine you are taking.  Have headaches that keep coming back (recurring).  Feel dizzy.  Have swelling in your ankles.  Have trouble with your vision. Get help right away if you:  Get a very bad headache.  Start to feel mixed up (confused).  Feel weak or numb.  Feel faint.  Have very bad pain in your: ? Chest. ? Belly (abdomen).  Throw up more than once.  Have trouble breathing. Summary  Hypertension is another name for high blood pressure.  High blood pressure forces your heart to work harder to pump blood.  For most people, a normal blood pressure is less than 120/80.  Making healthy choices can help lower blood pressure. If your blood pressure does not get lower with healthy choices, you may need to take medicine. This information is not intended to replace advice given to you by your health care provider. Make sure you discuss any questions you have with your health care provider. Document Revised: 06/28/2018 Document Reviewed: 06/28/2018 Elsevier Patient Education  2020 [employer].

## 2019-11-08 NOTE — Progress Notes (Signed)
Pt. Is here to establish care.   Pt. Is requesting his blood pressure medication send to the pharmacy at [facility].

## 2019-11-09 LAB — BASIC METABOLIC PANEL WITH GFR
BUN/Creatinine Ratio: 10 (ref 9–20)
BUN: 10 mg/dL (ref 6–20)
CO2: 24 mmol/L (ref 20–29)
Calcium: 9.9 mg/dL (ref 8.7–10.2)
Chloride: 102 mmol/L (ref 96–106)
Creatinine, Ser: 1.03 mg/dL (ref 0.76–1.27)
GFR calc Af Amer: 113 mL/min/1.73
GFR calc non Af Amer: 98 mL/min/1.73
Glucose: 88 mg/dL (ref 65–99)
Potassium: 4.2 mmol/L (ref 3.5–5.2)
Sodium: 141 mmol/L (ref 134–144)

## 2019-11-12 ENCOUNTER — Telehealth: Payer: Self-pay | Admitting: *Deleted

## 2019-11-12 NOTE — Telephone Encounter (Signed)
-----   Message from Cain Saupe, MD sent at 11/10/2019  5:20 PM EST ----- Normal

## 2019-11-12 NOTE — Telephone Encounter (Signed)
Patient verified DOB Patient is aware of BMP being normal.

## 2019-11-26 MED FILL — LOSARTAN POTASSIUM 100 MG T: 100 | 30 days supply | Qty: 30 | Fill #1

## 2019-11-26 MED FILL — ?METOPROLOL 25 MG TABLET: 25 | 30 days supply | Qty: 60 | Fill #1

## 2019-11-26 MED FILL — CLOPIDOGREL 75 MG TABLET: 75 | 30 days supply | Qty: 30 | Fill #1

## 2019-11-26 MED FILL — ?ATORVASTATIN 40MG TABL: 40 | 30 days supply | Qty: 30 | Fill #1

## 2019-11-26 MED FILL — ?NITROGLYCERIN 0.4MG SUBLIN: 0.4 | 25 days supply | Qty: 25 | Fill #0

## 2019-12-07 ENCOUNTER — Ambulatory Visit: Payer: Self-pay | Admitting: Cardiology
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
## 2019-12-13 ENCOUNTER — Ambulatory Visit: Payer: Self-pay | Admitting: Cardiology

## 2019-12-28 MED FILL — CLOPIDOGREL 75 MG TABLET: 75 | 30 days supply | Qty: 30 | Fill #2

## 2019-12-28 MED FILL — ?NITROGLYCERIN 0.4MG SUBLIN: 0.4 | 25 days supply | Qty: 25 | Fill #1

## 2019-12-28 MED FILL — LOSARTAN POTASSIUM 100 MG T: 100 | 30 days supply | Qty: 30 | Fill #2

## 2019-12-28 MED FILL — ?ATORVASTATIN 40MG TABLET: 40 | 30 days supply | Qty: 30 | Fill #2

## 2019-12-28 MED FILL — METOPROLOL TARTRATE 25 MG T: 25 | 30 days supply | Qty: 60 | Fill #2
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
## 2020-01-09 ENCOUNTER — Ambulatory Visit: Payer: Self-pay | Admitting: Physician Assistant

## 2020-02-02 ENCOUNTER — Encounter: Payer: Self-pay | Admitting: Family Medicine

## 2020-02-04 ENCOUNTER — Telehealth: Payer: Self-pay | Admitting: Pharmacist

## 2020-02-04 MED FILL — LOSARTAN POTASSIUM 100 MG T: 100 | 30 days supply | Qty: 30 | Fill #3

## 2020-02-04 MED FILL — ?METOPROLOL TART 25MG TABLE: 25 | 30 days supply | Qty: 60 | Fill #3

## 2020-02-04 MED FILL — ?ATORVASTATIN 40MG TABLET: 40 | 30 days supply | Qty: 30 | Fill #3

## 2020-02-04 MED FILL — ?NITROGLYCERIN 0.4MG SUBLIN: 0.4 | 25 days supply | Qty: 25 | Fill #2

## 2020-02-04 MED FILL — ?CLOPIDOGREL 75MG TA: 75 | 30 days supply | Qty: 30 | Fill #0

## 2020-02-04 NOTE — Telephone Encounter (Signed)
Dr. Jillyn Hidden contacted me to schedule BP check with patient. Will forward to scheduling.

## 2020-02-06 ENCOUNTER — Telehealth: Payer: Self-pay | Admitting: Family Medicine

## 2020-02-06 NOTE — Telephone Encounter (Signed)
Called patient and LVM to return call and schedule an appointment with [facility] for a BP check.

## 2020-02-08 ENCOUNTER — Other Ambulatory Visit: Payer: Self-pay

## 2020-02-08 ENCOUNTER — Encounter: Payer: Self-pay | Admitting: Pharmacist

## 2020-02-08 ENCOUNTER — Ambulatory Visit: Payer: Self-pay | Attending: Family Medicine | Admitting: Pharmacist

## 2020-02-08 VITALS — BP 168/102

## 2020-02-08 DIAGNOSIS — I10 Essential (primary) hypertension: Secondary | ICD-10-CM

## 2020-02-08 MED ORDER — AMLODIPINE BESYLATE 5 MG PO TABS: 5.0000 mg | ORAL_TABLET | Freq: Every day | ORAL | 0 refills | Status: DC

## 2020-02-08 MED FILL — AMLODIPINE BESYLATE 5 MG TA: 5 | 30 days supply | Qty: 30 | Fill #0
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
## 2020-02-29 ENCOUNTER — Ambulatory Visit: Payer: Self-pay | Admitting: Pharmacist

## 2020-03-03 ENCOUNTER — Other Ambulatory Visit: Payer: Self-pay | Admitting: Family Medicine

## 2020-03-03 MED FILL — ?ATORVASTATIN 40MG TABLET: 40 | 30 days supply | Qty: 30 | Fill #4

## 2020-03-03 MED FILL — ?CLOPIDOGREL 75MG TA: 75 | 30 days supply | Qty: 30 | Fill #1

## 2020-03-03 MED FILL — LOSARTAN POTASSIUM 100 MG T: 100 | 30 days supply | Qty: 30 | Fill #4

## 2020-03-03 MED FILL — ?METOPROLOL TART 25MG TABLE: 25 | 30 days supply | Qty: 60 | Fill #4

## 2020-03-03 MED FILL — ?NITROGLYCERIN 0.4MG SUBLIN: 0.4 | 25 days supply | Qty: 25 | Fill #3

## 2020-03-05 ENCOUNTER — Ambulatory Visit: Payer: Self-pay | Admitting: Pharmacist

## 2020-03-05 NOTE — Telephone Encounter (Signed)
1) Medication(s) Requested (by name):   amLODipine (NORVASC) 5 MG tablet  2) Pharmacy of Choice: [facility] pharmacy   3) Special Requests:   Approved medications will be sent to the pharmacy, we will reach out if there is an issue.  Requests made after 3pm may not be addressed until the following business day!  If a patient is unsure of the name of the medication(s) please note and ask patient to call back when they are able to provide all info, do not send to responsible party until all information is available!

## 2020-03-07 ENCOUNTER — Ambulatory Visit: Payer: Self-pay | Admitting: Pharmacist

## 2020-03-11 ENCOUNTER — Encounter: Payer: Self-pay | Admitting: Family Medicine

## 2020-03-11 ENCOUNTER — Other Ambulatory Visit: Payer: Self-pay

## 2020-03-11 ENCOUNTER — Encounter: Payer: Self-pay | Admitting: Pharmacist

## 2020-03-11 ENCOUNTER — Ambulatory Visit (HOSPITAL_BASED_OUTPATIENT_CLINIC_OR_DEPARTMENT_OTHER): Payer: Self-pay | Admitting: Family Medicine

## 2020-03-11 ENCOUNTER — Ambulatory Visit: Payer: Self-pay | Attending: Family Medicine | Admitting: Pharmacist

## 2020-03-11 VITALS — BP 150/101 | HR 74

## 2020-03-11 VITALS — BP 150/101 | HR 74 | Ht 67.0 in | Wt 210.0 lb

## 2020-03-11 DIAGNOSIS — Z79899 Other long term (current) drug therapy: Secondary | ICD-10-CM | POA: Insufficient documentation

## 2020-03-11 DIAGNOSIS — Z7902 Long term (current) use of antithrombotics/antiplatelets: Secondary | ICD-10-CM | POA: Insufficient documentation

## 2020-03-11 DIAGNOSIS — I252 Old myocardial infarction: Secondary | ICD-10-CM | POA: Insufficient documentation

## 2020-03-11 DIAGNOSIS — Z7982 Long term (current) use of aspirin: Secondary | ICD-10-CM | POA: Insufficient documentation

## 2020-03-11 DIAGNOSIS — Z833 Family history of diabetes mellitus: Secondary | ICD-10-CM | POA: Insufficient documentation

## 2020-03-11 DIAGNOSIS — I10 Essential (primary) hypertension: Secondary | ICD-10-CM | POA: Insufficient documentation

## 2020-03-11 DIAGNOSIS — I25118 Atherosclerotic heart disease of native coronary artery with other forms of angina pectoris: Secondary | ICD-10-CM

## 2020-03-11 DIAGNOSIS — I240 Acute coronary thrombosis not resulting in myocardial infarction: Secondary | ICD-10-CM

## 2020-03-11 DIAGNOSIS — Z951 Presence of aortocoronary bypass graft: Secondary | ICD-10-CM | POA: Insufficient documentation

## 2020-03-11 DIAGNOSIS — Z8249 Family history of ischemic heart disease and other diseases of the circulatory system: Secondary | ICD-10-CM | POA: Insufficient documentation

## 2020-03-11 MED ORDER — AMLODIPINE BESYLATE 5 MG PO TABS: 5.0000 mg | ORAL_TABLET | Freq: Every day | ORAL | 0 refills | Status: DC

## 2020-03-11 NOTE — Progress Notes (Signed)
Patient states that he is having chest pains.  States that he has taken Nitrostat for pain, last dose was on Sunday.

## 2020-03-11 NOTE — Patient Instructions (Signed)
Angina  Angina is very bad discomfort or pain in the chest, neck, arm, jaw, or back. The discomfort is caused by a lack of blood in the middle layer of the heart wall (myocardium). What are the causes? This condition is caused by a buildup of fat and cholesterol (plaque) in your arteries (atherosclerosis). This buildup narrows the arteries and makes it hard for blood to flow. What increases the risk? You are more likely to develop this condition if:  You have high levels of cholesterol in your blood.  You have high blood pressure (hypertension).  You have diabetes.  You have a family history of heart disease.  You are not active, or you do not exercise enough.  You feel sad (depressed).  You have been treated with high energy rays (radiation) on the left side of your chest. Other risk factors are:  Using tobacco.  Being very overweight (obese).  Eating a diet high in unhealthy fats (saturated fats).  Having stress, or being exposed to things that cause stress.  Using drugs, such as cocaine. Women have a greater risk for angina if:  They are older than 55.  They have stopped having their period (are in postmenopause). What are the signs or symptoms? Common symptoms of this condition in both men and women may include:  Chest pain, which may: ? Feel like a crushing or squeezing in the chest. ? Feel like a tightness, pressure, fullness, or heaviness in the chest. ? Last for more than a few minutes at a time. ? Stop and come back (recur) after a few minutes.  Pain in the neck, arm, jaw, or back.  Heartburn or upset stomach (indigestion) for no reason.  Being short of breath.  Feeling sick to your stomach (nauseous).  Sudden cold sweats. Women and people with diabetes may have other symptoms that are not usual, such as feeling:  Tired (fatigue).  Worried or nervous (anxious) for no reason.  Weak for no reason.  Dizzy or passing out (fainting). How is this  treated? This condition may be treated with:  Medicines. These are given to: ? Prevent blood clots. ? Prevent heart attack. ? Relax blood vessels and improve blood flow to the heart (nitrates). ? Reduce blood pressure. ? Improve the pumping action of the heart. ? Reduce fat and cholesterol in the blood.  A procedure to widen a narrowed or blocked artery in the heart (angioplasty).  Surgery to allow blood to go around a blocked artery (coronary artery bypass surgery). Follow these instructions at home: Medicines  Take over-the-counter and prescription medicines only as told by your doctor.  Do not take these medicines unless your doctor says that you can: ? NSAIDs. These include:  Ibuprofen.  Naproxen. ? Vitamin supplements that have vitamin A, vitamin E, or both. ? Hormone therapy that contains estrogen with or without progestin. Eating and drinking   Eat a heart-healthy diet that includes: ? Lots of fresh fruits and vegetables. ? Whole grains. ? Low-fat (lean) protein. ? Low-fat dairy products.  Follow instructions from your doctor about what you cannot eat or drink. Activity  Follow an exercise program that your doctor tells you.  Talk with your doctor about joining a program to help improve the health of your heart (cardiac rehab).  When you feel tired, take a break. Plan breaks if you know you are going to feel tired. Lifestyle   Do not use any products that contain nicotine or tobacco. This includes cigarettes, e-cigarettes, and   chewing tobacco. If you need help quitting, ask your doctor.  If your doctor says you can drink alcohol: ? Limit how much you use to:  0-1 drink a day for women who are not pregnant.  0-2 drinks a day for men. ? Be aware of how much alcohol is in your drink. In the [place] one drink equals:  One 12 oz bottle of beer (355 mL).  One 5 oz glass of wine (148 mL).  One 1 oz glass of hard liquor (44 mL). General instructions  Stay  at a healthy weight. If your doctor tells you to do so, work with him or her to lose weight.  Learn to deal with stress. If you need help, ask your doctor.  Keep your vaccines up to date. Get a flu shot every year.  Talk with your doctor if you feel sad. Take a screening test to see if you are at risk for depression.  Work with your doctor to manage any other health problems that you have. These may include diabetes or high blood pressure.  Keep all follow-up visits as told by your doctor. This is important. Get help right away if:  You have pain in your chest, neck, arm, jaw, or back, and the pain: ? Lasts more than a few minutes. ? Comes back. ? Does not get better after you take medicine under your tongue (sublingual nitroglycerin). ? Keeps getting worse. ? Comes more often.  You have any of these problems for no reason: ? Sweating a lot. ? Heartburn or upset stomach. ? Shortness of breath. ? Trouble breathing. ? Feeling sick to your stomach. ? Throwing up (vomiting). ? Feeling more tired than normal. ? Feeling nervous or worrying more than normal. ? Weakness.  You are suddenly dizzy or light-headed.  You pass out. These symptoms may be an emergency. Do not wait to see if the symptoms will go away. Get medical help right away. Call your local emergency services (911 in the [place] Do not drive yourself to the hospital. Summary  Angina is very bad discomfort or pain in the chest, neck, arm, neck, or back.  Symptoms include chest pain, heartburn or upset stomach for no reason, and shortness of breath.  Women or people with diabetes may have symptoms that are not usual, such as feeling nervous or worried for no reason, weak for no reason, or tired.  Take all medicines only as told by your doctor.  You should eat a heart-healthy diet and follow an exercise program. This information is not intended to replace advice given to you by your health care provider. Make sure you  discuss any questions you have with your health care provider. Document Revised: 06/05/2018 Document Reviewed: 06/05/2018 Elsevier Patient Education  2020 Elsevier Inc.  
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
## 2020-03-27 ENCOUNTER — Encounter: Payer: Self-pay | Admitting: Physician Assistant
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
## 2020-04-02 ENCOUNTER — Encounter: Payer: Self-pay | Admitting: Physician Assistant

## 2020-04-02 ENCOUNTER — Other Ambulatory Visit: Payer: Self-pay

## 2020-04-02 ENCOUNTER — Ambulatory Visit (INDEPENDENT_AMBULATORY_CARE_PROVIDER_SITE_OTHER): Payer: Self-pay | Admitting: Physician Assistant

## 2020-04-02 ENCOUNTER — Telehealth: Payer: Self-pay | Admitting: *Deleted

## 2020-04-02 VITALS — BP 148/92 | HR 78 | Ht 66.5 in | Wt 221.0 lb

## 2020-04-02 DIAGNOSIS — R072 Precordial pain: Secondary | ICD-10-CM

## 2020-04-02 DIAGNOSIS — Z9889 Other specified postprocedural states: Secondary | ICD-10-CM

## 2020-04-02 DIAGNOSIS — I513 Intracardiac thrombosis, not elsewhere classified: Secondary | ICD-10-CM

## 2020-04-02 DIAGNOSIS — I2510 Atherosclerotic heart disease of native coronary artery without angina pectoris: Secondary | ICD-10-CM

## 2020-04-02 DIAGNOSIS — I5042 Chronic combined systolic (congestive) and diastolic (congestive) heart failure: Secondary | ICD-10-CM

## 2020-04-02 LAB — CBC
Hematocrit: 43.5 % (ref 37.5–51.0)
Hemoglobin: 15.1 g/dL (ref 13.0–17.7)
MCH: 30.8 pg (ref 26.6–33.0)
MCHC: 34.7 g/dL (ref 31.5–35.7)
MCV: 89 fL (ref 79–97)
Platelets: 334 10*3/uL (ref 150–450)
RBC: 4.91 x10E6/uL (ref 4.14–5.80)
RDW: 13.7 % (ref 11.6–15.4)
WBC: 7.3 10*3/uL (ref 3.4–10.8)

## 2020-04-02 LAB — BASIC METABOLIC PANEL
BUN/Creatinine Ratio: 14 (ref 9–20)
BUN: 13 mg/dL (ref 6–20)
CO2: 21 mmol/L (ref 20–29)
Calcium: 9.8 mg/dL (ref 8.7–10.2)
Chloride: 101 mmol/L (ref 96–106)
Creatinine, Ser: 0.94 mg/dL (ref 0.76–1.27)
GFR calc Af Amer: 126 mL/min/{1.73_m2} (ref >59)
GFR calc non Af Amer: 109 mL/min/{1.73_m2} (ref >59)
Glucose: 89 mg/dL (ref 65–99)
Potassium: 3.8 mmol/L (ref 3.5–5.2)
Sodium: 140 mmol/L (ref 134–144)

## 2020-04-02 LAB — TSH: TSH: 4.56 u[IU]/mL — ABNORMAL HIGH (ref 0.450–4.500)

## 2020-04-02 MED ORDER — CARVEDILOL 12.5 MG PO TABS: 12.5000 mg | ORAL_TABLET | Freq: Two times a day (BID) | ORAL | 6 refills | Status: DC

## 2020-04-02 MED FILL — CARVEDILOL 12.5 MG TABLET: 12.5 | 30 days supply | Qty: 60 | Fill #0

## 2020-04-02 NOTE — Patient Instructions (Addendum)
Medication Instructions:  Your physician has recommended you make the following change in your medication:  1.  STOP Metoprolol 2.  START Carvedilol 12.5 mg taking 1 tablet twice a day  *If you need a refill on your cardiac medications before your next appointment, please call your pharmacy*   Lab Work: TODAY:  BMET, CBC, & TSH  If you have labs (blood work) drawn today and your tests are completely normal, you will receive your results only by: [name] MyChart Message (if you have MyChart) OR . A paper copy in the mail If you have any lab test that is abnormal or we need to change your treatment, we will call you to review the results.   Testing/Procedures: None ordered   Follow-Up: At [facility], you and your health needs are our priority.  As part of our continuing mission to provide you with exceptional heart care, we have created designated Provider Care Teams.  These Care Teams include your primary Cardiologist (physician) and Advanced Practice Providers (APPs -  Physician Assistants and Nurse Practitioners) who all work together to provide you with the care you need, when you need it.  We recommend signing up for the patient portal called "MyChart".  Sign up information is provided on this After Visit Summary.  MyChart is used to connect with patients for Virtual Visits (Telemedicine).  Patients are able to view lab/test results, encounter notes, upcoming appointments, etc.  Non-urgent messages can be sent to your provider as well.   To learn more about what you can do with MyChart, go to [email or web address].    Your next appointment:   1 week(s)   04/11/2020  11:15  The format for your next appointment:   Virtual Visit      YOU DO NOT NEED TO COME IN THE OFFICE FOR THIS VISIT.  WILL BE DONE VIA VIDEO OVER THE PHONE  Provider:   You may see Tobias Alexander, MD or one of the following Advanced Practice Providers on your designated Care Team:    Ronie Spies, PA-C  Jacolyn Reedy, PA-C    Other Instructions  Your [facility] team (Cardiologist [facility] Doctor] and Advanced Practice Provider [phone] Assistant; Nurse Practitioner]) has arranged for your next office appointment to be a virtual visit (also known as "Telehealth", "Telemedicine", "E-Visit").   We now offer virtual visits for all our patients.  This helps [place] to expand our ability to see patients in a timely and safe manner.  These visits are billed to your insurance just like traditional, in person, appointments.  Please review this IMPORTANT information about your upcoming appointment.   **PLEASE READ THE SECTION BELOW LABELED "CONSENT".**   **CALL OUR OFFICE WITH QUESTIONS.**   WHAT YOU NEED FOR YOUR VIRTUAL VISIT:  You will need a SmartPhone with microphone and video capability.  [If you are using MyChart to connect to your visit, it is also possible to use a desktop/laptop computer (with an Internet connection), as long as you have microphone and video capability.]  You will need to use Chrome, [employer] or [employer] as your Chiropodist.  We highly recommend that you have a MyChart account as this will make connecting to your visit seamless.  A MyChart account not only allows you to connect to your provider for a virtual visit, but also allows you to see the results of all your tests, provider notes, medications and upcoming appointments.    A MyChart account is not absolutely necessary.  We can still  complete your visit if you do not have one.    If you do not have a computer or SmartPhone with video/microphone capability or your Internet/cell service is weak on the day of your visit, we will do your visit by telephone.    A blood pressure cuff and scale are essential to collect your vital signs at home.  If you do not have these and you are unable to obtain them, please contact our office so that we can make arrangements for you.  If you have a pulse oximeter, Apple watch, Kardia mobile  device, etc, you can collect data from these devices as well to share with the provider for your visit.  These devices are not required for a virtual visit.    WHAT TO DO ON THE DAY OF YOUR APPOINTMENT: 30 minutes before your appointment:  Take your blood pressure, pulse or heart rate (if your blood pressure machine is able to collect it) and weight.  Write all these numbers down so you can give it to the nurse or medical assistant that calls.  Get all of the medications you currently take and put them where you will be sitting for the appointment.  The nurse/medical assistant will go over these with you when he/she calls.  15 minutes before your appointment:  You will receive a phone call from a nurse or medical assistant from our office.  The caller ID on your phone may indicate that the caller is either "[facility]" or "[place]".  However, the number may come across as spam.  Please turn off any spam blocker so you do not miss the call.  The nurse will:  Ask for your blood pressure, pulse, weight, height.  Go over all of your medications to make sure your chart is correct.  Review your allergies, smoking history, reason for appointment, etc.   Give you instructions on how to connect with the video platform or telephone.  IF YOUR VISIT IS BY TELEPHONE ONLY: After the nurse finishes getting you ready for the visit, your provider will call you on the phone number you provide to [place].   TO CONNECT WITH YOUR PROVIDER FOR YOUR APPOINTMENT (BY VIDEO): You will either connect with the provider with your MyChart account or (if you are not using MyChart) with a link sent to your SmartPhone by text message.  If you are using MyChart, see below.  If you are not using MyChart, the nurse will send you the text message during the phone call.     If you are connecting with your MyChart account:   (The nurse that calls you will tell you when to do this):  You will log into your account. At  the top of your home screen, you should see the following prompt that tells you to "Begin your video visit with . . . ".  Click the green button (BEGIN VISIT).    The next screen will say "It's time to start your video visit!" Click the green button (BEGIN VIDEO VISIT)    There may be a screen that appears that asks for permission to use your camera and/or microphone.  Click ALLOW.  This will open the browser where your appointment will take place.   You may see the message: "Welcome.  Waiting for the call to begin."  Or, you will see that the nurse or your provider are already "in" the room waiting on you.   If you are connecting with a link sent  to your SmartPhone via text: The nurse that calls you will send the link by text message. Click on the link (it should look something like this):     If asked to give permission to use the camera and or microphone, click ALLOW This will open the browser where your appointment will take place.      You may see the message: "Welcome.  Waiting for the call to begin."  Or, you will see that the nurse or your provider are already "in" the room waiting on you.    The controls for your visit look like the picture below.  Please note that this is what the microphone and camera look like when they are ON.  If muted, they will have a line through them.    After the appointment: Once your provider leaves the appointment, he/she will go over instructions with the nurse/medical assistant.  If needed, this person will call you with any instructions, appointments, etc.   A copy of your After Visit Summary (AVS) will be available later that day in your MyChart account.  This document will have all of your instructions, medications, appointments, etc.  If you are not using MyChart, we will mail it to your home.     *CONSENT FOR TELE-HEALTH VISIT - PLEASE REVIEW* By participating in the scheduled virtual visit (and any virtual visit scheduled within 365  days of the printing of this document), I agree to the following:   I hereby voluntarily request, consent and authorize [facility] and its employed or contracted physicians, physician assistants, nurse practitioners or other licensed health care professionals (the Practitioner), to provide me with telemedicine health care services (the "Services") as deemed necessary by the treating Practitioner. I acknowledge and consent to receive the Services by the Practitioner via telemedicine. I understand that the telemedicine visit will involve communicating with the Practitioner through live audiovisual communication technology and the disclosure of certain medical information by electronic transmission. I acknowledge that I have been given the opportunity to request an in-person assessment or other available alternative prior to the telemedicine visit and am voluntarily participating in the telemedicine visit.  I understand that I have the right to withhold or withdraw my consent to the use of telemedicine in the course of my care at any time, without affecting my right to future care or treatment, and that the Practitioner or I may terminate the telemedicine visit at any time. I understand that I have the right to inspect all information obtained and/or recorded in the course of the telemedicine visit and may receive copies of available information for a reasonable fee.  I understand that some of the potential risks of receiving the Services via telemedicine include:  [name] Delay or interruption in medical evaluation due to technological equipment failure or disruption; . Information transmitted may not be sufficient (e.g. poor resolution of images) to allow for appropriate medical decision making by the Practitioner; and/or  . In rare instances, security protocols could fail, causing a breach of personal health information.  Furthermore, I acknowledge that it is my responsibility to provide information about my  medical history, conditions and care that is complete and accurate to the best of my ability. I acknowledge that Practitioner's advice, recommendations, and/or decision may be based on factors not within their control, such as incomplete or inaccurate data provided by me or distortions of diagnostic images or specimens that may result from electronic transmissions. I understand that the practice of medicine is not  an Visual merchandiser and that Practitioner makes no warranties or guarantees regarding treatment outcomes. I acknowledge that a copy of this consent can be made available to me via my patient portal [facility] MyChart), or I can request a printed copy by calling the office of [facility].    I understand that my insurance will be billed for this visit.   I have read or had this consent read to me. . I understand the contents of this consent, which adequately explains the benefits and risks of the Services being provided via telemedicine.  . I have been provided ample opportunity to ask questions regarding this consent and the Services and have had my questions answered to my satisfaction. . I give my informed consent for the services to be provided through the use of telemedicine in my medical care

## 2020-04-02 NOTE — Progress Notes (Signed)
I agree with this, if he doesn't improve we can order a stress test

## 2020-04-02 NOTE — Telephone Encounter (Signed)
°  Patient Consent for Virtual Visit         [name] has provided verbal consent on 04/02/2020 for a virtual visit (video or telephone).   CONSENT FOR VIRTUAL VISIT FOR:  [name]  By participating in this virtual visit I agree to the following:  I hereby voluntarily request, consent and authorize [facility] and its employed or contracted physicians, physician assistants, nurse practitioners or other licensed health care professionals (the Practitioner), to provide me with telemedicine health care services (the Services") as deemed necessary by the treating Practitioner. I acknowledge and consent to receive the Services by the Practitioner via telemedicine. I understand that the telemedicine visit will involve communicating with the Practitioner through live audiovisual communication technology and the disclosure of certain medical information by electronic transmission. I acknowledge that I have been given the opportunity to request an in-person assessment or other available alternative prior to the telemedicine visit and am voluntarily participating in the telemedicine visit.  I understand that I have the right to withhold or withdraw my consent to the use of telemedicine in the course of my care at any time, without affecting my right to future care or treatment, and that the Practitioner or I may terminate the telemedicine visit at any time. I understand that I have the right to inspect all information obtained and/or recorded in the course of the telemedicine visit and may receive copies of available information for a reasonable fee.  I understand that some of the potential risks of receiving the Services via telemedicine include:   Delay or interruption in medical evaluation due to technological equipment failure or disruption;  Information transmitted may not be sufficient (e.g. poor resolution of images) to allow for appropriate medical decision making by the  Practitioner; and/or   In rare instances, security protocols could fail, causing a breach of personal health information.  Furthermore, I acknowledge that it is my responsibility to provide information about my medical history, conditions and care that is complete and accurate to the best of my ability. I acknowledge that Practitioner's advice, recommendations, and/or decision may be based on factors not within their control, such as incomplete or inaccurate data provided by me or distortions of diagnostic images or specimens that may result from electronic transmissions. I understand that the practice of medicine is not an exact science and that Practitioner makes no warranties or guarantees regarding treatment outcomes. I acknowledge that a copy of this consent can be made available to me via my patient portal [facility] MyChart), or I can request a printed copy by calling the office of [facility].    I understand that my insurance will be billed for this visit.   I have read or had this consent read to me.  I understand the contents of this consent, which adequately explains the benefits and risks of the Services being provided via telemedicine.   I have been provided ample opportunity to ask questions regarding this consent and the Services and have had my questions answered to my satisfaction.  I give my informed consent for the services to be provided through the use of telemedicine in my medical care

## 2020-04-03 ENCOUNTER — Telehealth: Payer: Self-pay | Admitting: Physician Assistant

## 2020-04-03 NOTE — Telephone Encounter (Signed)
Pt returned the call and has been made aware of his lab results. See result note.

## 2020-04-03 NOTE — Telephone Encounter (Signed)
Patient returning call for lab results. Transferred call to the nurse. 
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
## 2020-04-11 ENCOUNTER — Ambulatory Visit: Payer: Self-pay | Admitting: Family Medicine

## 2020-04-11 ENCOUNTER — Telehealth (INDEPENDENT_AMBULATORY_CARE_PROVIDER_SITE_OTHER): Payer: Self-pay | Admitting: Cardiology

## 2020-04-11 ENCOUNTER — Encounter: Payer: Self-pay | Admitting: Cardiology

## 2020-04-11 ENCOUNTER — Other Ambulatory Visit: Payer: Self-pay

## 2020-04-11 VITALS — BP 150/116 | HR 71 | Ht 66.5 in | Wt 221.0 lb

## 2020-04-11 DIAGNOSIS — R072 Precordial pain: Secondary | ICD-10-CM

## 2020-04-11 DIAGNOSIS — I2542 Coronary artery dissection: Secondary | ICD-10-CM

## 2020-04-11 DIAGNOSIS — I10 Essential (primary) hypertension: Secondary | ICD-10-CM

## 2020-04-11 DIAGNOSIS — I513 Intracardiac thrombosis, not elsewhere classified: Secondary | ICD-10-CM

## 2020-04-11 MED ORDER — AMLODIPINE BESYLATE 10 MG PO TABS: 10.0000 mg | ORAL_TABLET | Freq: Every day | ORAL | 6 refills | Status: DC

## 2020-04-11 MED ORDER — ISOSORBIDE MONONITRATE ER 30 MG PO TB24
15.0000 mg | ORAL_TABLET | Freq: Every day | ORAL | 6 refills | Status: DC
Start: 2020-04-11 — End: 2020-05-26

## 2020-04-11 NOTE — Patient Instructions (Addendum)
Medication Instructions:   Your physician has recommended you make the following change in your medication:   1) Increase Amlodipine to 10 mg, 1 tablet by mouth once a day 2) Start Imdur 30 mg, 0.5 tablet by mouth once a day  *If you need a refill on your cardiac medications before your next appointment, please call your pharmacy*  Lab Work:  None ordered today  Testing/Procedures:  None ordered today  Follow-Up: At [facility], you and your health needs are our priority.  As part of our continuing mission to provide you with exceptional heart care, we have created designated Provider Care Teams.  These Care Teams include your primary Cardiologist (physician) and Advanced Practice Providers (APPs -  Physician Assistants and Nurse Practitioners) who all work together to provide you with the care you need, when you need it.  On 04/29/20 at 10:40AM with Tobias Alexander, MD

## 2020-04-14 ENCOUNTER — Other Ambulatory Visit: Payer: Self-pay | Admitting: Family Medicine

## 2020-04-14 DIAGNOSIS — I25118 Atherosclerotic heart disease of native coronary artery with other forms of angina pectoris: Secondary | ICD-10-CM

## 2020-04-14 MED FILL — LOSARTAN POTASSIUM 100 MG T: 100 | 30 days supply | Qty: 30 | Fill #5

## 2020-04-14 MED FILL — CLOPIDOGREL 75 MG TABLET: 75 | 30 days supply | Qty: 30 | Fill #2

## 2020-04-14 MED FILL — CARVEDILOL 12.5 MG TABLET: 12.5 | 30 days supply | Qty: 60 | Fill #0

## 2020-04-14 MED FILL — ATORVASTATIN CALCIUM 40 MG: 40 | 30 days supply | Qty: 30 | Fill #5

## 2020-04-15 MED FILL — NITROGLYCERIN 0.4 MG TAB SL: 0.4 | 10 days supply | Qty: 25 | Fill #0

## 2020-04-24 ENCOUNTER — Ambulatory Visit: Payer: Self-pay | Attending: Family Medicine

## 2020-04-24 ENCOUNTER — Other Ambulatory Visit: Payer: Self-pay

## 2020-04-24 NOTE — Progress Notes (Deleted)
Patient ID: [name], male   DOB: Nov 16, 1990, 29 y.o.   MRN: [id number]   Seen by cardiology  From cardiology A/P: 1.  Chest pain with history of CAD and possible scad: -Continues to have symptoms however improved since last OV -Plan was for further titration of antihypertensives as probable etiology of his symptoms -Reassuring NST from 11/2019 -BP today remains elevated at 150/116 therefore will uptitrate amlodipine to 10 mg p.o. daily -We will add Imdur 15 mg p.o. daily with plans for close follow-up -Lab work previously performed was stable  2.  Chronic combined CHF/ischemic cardiomyopathy: -Last echocardiogram with EF at 45 to 50% performed 11/2019 in the setting of chest pain  -Continue losartan 100 mg p.o. daily -Would consider spironolactone 12.5 mg at next visit for both BP as well  3.  History of LV thrombus: -Resolved on last echocardiogram from 11/2019 -Continue current regimen with ASA, Plavix -Tolerating medications well with no signs or symptoms of acute bleeding in stool or urine  4.  History of hemopericardium status post surgery 2016: -Echocardiogram from 11/2019 with grade 3 restrictive diastolic parameters but no evidence of pericardial effusion -Needs better BP control as above

## 2020-04-29 ENCOUNTER — Other Ambulatory Visit: Payer: Self-pay

## 2020-04-29 ENCOUNTER — Telehealth (INDEPENDENT_AMBULATORY_CARE_PROVIDER_SITE_OTHER): Payer: Self-pay | Admitting: Cardiology

## 2020-04-29 ENCOUNTER — Encounter: Payer: Self-pay | Admitting: Cardiology

## 2020-04-29 VITALS — BP 130/96 | HR 79 | Ht 66.5 in | Wt 220.0 lb

## 2020-04-29 DIAGNOSIS — I10 Essential (primary) hypertension: Secondary | ICD-10-CM

## 2020-04-29 DIAGNOSIS — I255 Ischemic cardiomyopathy: Secondary | ICD-10-CM

## 2020-04-29 DIAGNOSIS — I5042 Chronic combined systolic (congestive) and diastolic (congestive) heart failure: Secondary | ICD-10-CM

## 2020-04-29 DIAGNOSIS — I2121 ST elevation (STEMI) myocardial infarction involving left circumflex coronary artery: Secondary | ICD-10-CM

## 2020-04-29 DIAGNOSIS — I2542 Coronary artery dissection: Secondary | ICD-10-CM

## 2020-04-29 DIAGNOSIS — I2510 Atherosclerotic heart disease of native coronary artery without angina pectoris: Secondary | ICD-10-CM

## 2020-04-29 MED ORDER — SPIRONOLACTONE 25 MG PO TABS: 12.5000 mg | ORAL_TABLET | Freq: Every day | ORAL | 2 refills | Status: DC

## 2020-04-29 MED FILL — SPIRONOLACTONE 25 MG TABLET: 25 | 30 days supply | Qty: 15 | Fill #0
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
## 2020-04-29 NOTE — Patient Instructions (Signed)
Medication Instructions:   START TAKING SPIRONOLACTONE 12.5 MG BY MOUTH DAILY  *If you need a refill on your cardiac medications before your next appointment, please call your pharmacy*  Lab Work:  IN 4 WEEKS--SAME DAY YOU SEE OUR BLOOD PRESSURE CLINIC IN OUR OFFICE--WE WILL CHECK BMET AND PRO-BNP  If you have labs (blood work) drawn today and your tests are completely normal, you will receive your results only by: Marland Kitchen MyChart Message (if you have MyChart) OR . A paper copy in the mail If you have any lab test that is abnormal or we need to change your treatment, we will call you to review the results.  You have been referred to OUR BLOOD PRESSURE CLINIC TO SEE OUR PHARMACIST IN 4 WEEKS--YOU NEED LABS DONE SAME DAY--OUR SCHEDULING DEPARTMENT WILL CALL YOU SOON TO ARRANGE YOUR LAB AND BP CLINIC APPOINTMENT   Follow-Up:  4 MONTHS IN THE OFFICE WITH DR. NELSON--SCHEDULING WILL CALL YOU SOON TO ARRANGE THIS APPOINTMENT

## 2020-05-20 ENCOUNTER — Other Ambulatory Visit: Payer: Self-pay | Admitting: Family Medicine

## 2020-05-20 DIAGNOSIS — I25118 Atherosclerotic heart disease of native coronary artery with other forms of angina pectoris: Secondary | ICD-10-CM

## 2020-05-20 MED ORDER — NITROGLYCERIN 0.4 MG SL SUBL: 0.4000 mg | SUBLINGUAL_TABLET | SUBLINGUAL | 3 refills | Status: DC | PRN

## 2020-05-20 NOTE — Telephone Encounter (Signed)
PT need a refill nitroGLYCERIN (NITROSTAT) 0.4 MG SL tablet [[id number]]  [facility] - [place], [place] - [address]  [address] [place] [address]  Phone: [phone] Fax: [phone]

## 2020-05-20 NOTE — Addendum Note (Signed)
Addended by: Lisabeth Pick on: 05/20/2020 10:46 AM   Modules accepted: Orders

## 2020-05-20 NOTE — Telephone Encounter (Signed)
Requested Prescriptions  Pending Prescriptions Disp Refills  . nitroGLYCERIN (NITROSTAT) 0.4 MG SL tablet 25 tablet 3    Sig: Place 1 tablet (0.4 mg total) under the tongue every 5 (five) minutes as needed for chest pain.     Cardiovascular:  Nitrates Failed - 05/20/2020 10:46 AM      Failed - Last BP in normal range    BP Readings from Last 1 Encounters:  04/29/20 (!) 130/96         Passed - Last Heart Rate in normal range    Pulse Readings from Last 1 Encounters:  04/29/20 79         Passed - Valid encounter within last 12 months    Recent Outpatient Visits          3 weeks ago    [facility]   2 months ago Coronary artery disease of native artery of native heart with stable angina pectoris (HCC)   [facility] Hoy Register, MD   2 months ago Essential hypertension   [facility] [place], Cornelius Moras, RPH-CPP   3 months ago Essential hypertension   [facility] [place], Cornelius Moras, RPH-CPP   6 months ago Coronary artery disease of native artery of native heart with stable angina pectoris [facility])   [facility] [facility] Cain Saupe, MD      Future Appointments            In 2 days Storm Frisk, MD [facility]   In 6 days  [facility] [address] Office, [facility]   In 3 months Delton See, Faustino Congress, MD [facility] [address] Office, [facility]

## 2020-05-21 MED FILL — CLOPIDOGREL 75 MG TABLET: 75 | 30 days supply | Qty: 30 | Fill #3

## 2020-05-21 MED FILL — SPIRONOLACTONE 25 MG TABLET: 25 | 30 days supply | Qty: 15 | Fill #0

## 2020-05-21 MED FILL — CARVEDILOL 12.5 MG TABLET: 12.5 | 30 days supply | Qty: 60 | Fill #1

## 2020-05-21 MED FILL — NITROGLYCERIN 0.4 MG TAB SL: 0.4 | 10 days supply | Qty: 25 | Fill #1

## 2020-05-21 MED FILL — LOSARTAN POTASSIUM 100 MG T: 100 | 30 days supply | Qty: 30 | Fill #0

## 2020-05-21 MED FILL — AMLODIPINE BESYLATE 10 MG T: 10 | 30 days supply | Qty: 30 | Fill #1

## 2020-05-21 MED FILL — ATORVASTATIN CALCIUM 40 MG: 40 | 30 days supply | Qty: 30 | Fill #0

## 2020-05-21 MED FILL — ISOSORBIDE MN ER 30 MG TAB: 30 | 30 days supply | Qty: 15 | Fill #1

## 2020-05-22 ENCOUNTER — Ambulatory Visit: Payer: Self-pay | Admitting: Critical Care Medicine
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
## 2020-05-26 ENCOUNTER — Other Ambulatory Visit: Payer: Self-pay

## 2020-05-26 ENCOUNTER — Other Ambulatory Visit: Payer: Self-pay | Admitting: *Deleted

## 2020-05-26 ENCOUNTER — Ambulatory Visit (INDEPENDENT_AMBULATORY_CARE_PROVIDER_SITE_OTHER): Payer: Self-pay | Admitting: Pharmacist

## 2020-05-26 DIAGNOSIS — I10 Essential (primary) hypertension: Secondary | ICD-10-CM

## 2020-05-26 DIAGNOSIS — I2121 ST elevation (STEMI) myocardial infarction involving left circumflex coronary artery: Secondary | ICD-10-CM

## 2020-05-26 DIAGNOSIS — I5042 Chronic combined systolic (congestive) and diastolic (congestive) heart failure: Secondary | ICD-10-CM

## 2020-05-26 DIAGNOSIS — I2542 Coronary artery dissection: Secondary | ICD-10-CM

## 2020-05-26 DIAGNOSIS — I255 Ischemic cardiomyopathy: Secondary | ICD-10-CM

## 2020-05-26 DIAGNOSIS — I2510 Atherosclerotic heart disease of native coronary artery without angina pectoris: Secondary | ICD-10-CM

## 2020-05-26 LAB — BASIC METABOLIC PANEL
BUN/Creatinine Ratio: 8 — ABNORMAL LOW (ref 9–20)
BUN: 9 mg/dL (ref 6–20)
CO2: 23 mmol/L (ref 20–29)
Calcium: 9.5 mg/dL (ref 8.7–10.2)
Chloride: 100 mmol/L (ref 96–106)
Creatinine, Ser: 1.09 mg/dL (ref 0.76–1.27)
GFR calc Af Amer: 105 mL/min/{1.73_m2} (ref >59)
GFR calc non Af Amer: 91 mL/min/{1.73_m2} (ref >59)
Glucose: 101 mg/dL — ABNORMAL HIGH (ref 65–99)
Potassium: 4 mmol/L (ref 3.5–5.2)
Sodium: 139 mmol/L (ref 134–144)

## 2020-05-26 LAB — PRO B NATRIURETIC PEPTIDE: NT-Pro BNP: 77 pg/mL (ref 0–86)

## 2020-05-26 MED ORDER — ISOSORBIDE MONONITRATE ER 30 MG PO TB24: 30.0000 mg | ORAL_TABLET | Freq: Every day | ORAL | 11 refills | Status: DC

## 2020-05-26 MED FILL — ISOSORBIDE MN ER 30 MG TAB: 30 | 30 days supply | Qty: 30 | Fill #0

## 2020-05-26 NOTE — Patient Instructions (Addendum)
Start monitoring your blood pressure at home. Your goal is less than 130/3mmHg  Increase your isosorbide (Imdur) from 1/2 tablet to 1 tablet each day.   Continue taking your other medications  Limit your daily sodium intake to less than 2,000mg  daily  Try to start walking 30 minutes most days a week  Follow up in clinic in 1 month for a blood pressure check
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
## 2020-06-19 MED FILL — SPIRONOLACTONE 25 MG TABLET: 25 | 30 days supply | Qty: 15 | Fill #1

## 2020-06-19 MED FILL — AMLODIPINE BESYLATE 10 MG T: 10 | 30 days supply | Qty: 30 | Fill #2

## 2020-06-19 MED FILL — NITROGLYCERIN 0.4 MG TAB SL: 0.4 | 20 days supply | Qty: 25 | Fill #2

## 2020-06-19 MED FILL — CLOPIDOGREL 75 MG TABLET: 75 | 30 days supply | Qty: 30 | Fill #4

## 2020-06-19 MED FILL — CARVEDILOL 12.5 MG TABLET: 12.5 | 30 days supply | Qty: 60 | Fill #2

## 2020-06-19 MED FILL — ISOSORBIDE MN ER 30 MG TAB: 30 | 30 days supply | Qty: 30 | Fill #0

## 2020-06-19 MED FILL — LOSARTAN POTASSIUM 100 MG T: 100 | 30 days supply | Qty: 30 | Fill #1

## 2020-06-20 ENCOUNTER — Telehealth: Payer: Self-pay | Admitting: Cardiology

## 2020-06-20 NOTE — Telephone Encounter (Signed)
Patient returned call to [facility] regarding labs. Transferred call to [facility].

## 2020-06-20 NOTE — Telephone Encounter (Signed)
Patient called back. Informed him of lab results.

## 2020-06-20 NOTE — Telephone Encounter (Signed)
Left message for patient to call back. Per Dr. Mayford Knife -Stable labs - continue current meds and forward to PCP

## 2020-06-20 NOTE — Telephone Encounter (Signed)
Patient is returning someone's call. No notes, labs, imaging, procedures, etc., to reference back to. Please advise.
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
## 2020-06-25 ENCOUNTER — Other Ambulatory Visit: Payer: Self-pay

## 2020-06-25 ENCOUNTER — Ambulatory Visit (INDEPENDENT_AMBULATORY_CARE_PROVIDER_SITE_OTHER): Payer: Self-pay | Admitting: Pharmacist

## 2020-06-25 VITALS — BP 130/92 | HR 75

## 2020-06-25 DIAGNOSIS — I2510 Atherosclerotic heart disease of native coronary artery without angina pectoris: Secondary | ICD-10-CM

## 2020-06-25 DIAGNOSIS — I2121 ST elevation (STEMI) myocardial infarction involving left circumflex coronary artery: Secondary | ICD-10-CM

## 2020-06-25 DIAGNOSIS — I5042 Chronic combined systolic (congestive) and diastolic (congestive) heart failure: Secondary | ICD-10-CM

## 2020-06-25 DIAGNOSIS — I255 Ischemic cardiomyopathy: Secondary | ICD-10-CM

## 2020-06-25 DIAGNOSIS — I10 Essential (primary) hypertension: Secondary | ICD-10-CM

## 2020-06-25 DIAGNOSIS — I2542 Coronary artery dissection: Secondary | ICD-10-CM

## 2020-06-25 MED ORDER — SPIRONOLACTONE 25 MG PO TABS: 25.0000 mg | ORAL_TABLET | Freq: Every day | ORAL | 11 refills | Status: DC

## 2020-06-25 NOTE — Patient Instructions (Addendum)
It was nice to see you today  Your blood pressure goal is < 130/45mmHg  Increase your spironolactone to 1 full tablet (25mg ) once a day  Continue taking your other medications  Continue to walk multiple days each week and limit the sodium in your diet to < 2,000mg  daily  Monitor your blood pressure at home and bring your readings to your next visit in 1 month

## 2020-07-09 MED FILL — ?SPIRONOLACTONE 25 MG TABLE: 25 | 30 days supply | Qty: 30 | Fill #0

## 2020-07-15 ENCOUNTER — Encounter: Payer: Self-pay | Admitting: Cardiology

## 2020-07-21 ENCOUNTER — Other Ambulatory Visit (HOSPITAL_COMMUNITY): Payer: Self-pay | Admitting: Family Medicine

## 2020-07-22 ENCOUNTER — Other Ambulatory Visit: Payer: Self-pay | Admitting: Family Medicine

## 2020-07-22 DIAGNOSIS — I2121 ST elevation (STEMI) myocardial infarction involving left circumflex coronary artery: Secondary | ICD-10-CM

## 2020-07-22 DIAGNOSIS — I25118 Atherosclerotic heart disease of native coronary artery with other forms of angina pectoris: Secondary | ICD-10-CM

## 2020-07-22 MED FILL — NITROGLYCERIN 0.4 MG TAB SL: 0.4 | 20 days supply | Qty: 25 | Fill #3

## 2020-07-22 MED FILL — SPIRONOLACTONE 25 MG TABLET: 25 | 30 days supply | Qty: 30 | Fill #1

## 2020-07-22 MED FILL — CLOPIDOGREL 75 MG TABLET: 75 | 30 days supply | Qty: 30 | Fill #0

## 2020-07-22 MED FILL — LOSARTAN POTASSIUM 100 MG T: 100 | 30 days supply | Qty: 30 | Fill #2

## 2020-07-22 MED FILL — ISOSORBIDE MN ER 30 MG TAB: 30 | 30 days supply | Qty: 30 | Fill #1

## 2020-07-22 MED FILL — CARVEDILOL 12.5 MG TABLET: 12.5 | 30 days supply | Qty: 60 | Fill #3

## 2020-07-22 MED FILL — ATORVASTATIN CALCIUM 40 MG: 40 | 30 days supply | Qty: 30 | Fill #1

## 2020-07-22 MED FILL — AMLODIPINE BESYLATE 10 MG T: 10 | 30 days supply | Qty: 30 | Fill #3
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
## 2020-07-23 ENCOUNTER — Other Ambulatory Visit (HOSPITAL_COMMUNITY): Payer: Self-pay | Admitting: Cardiology

## 2020-07-23 ENCOUNTER — Other Ambulatory Visit: Payer: Self-pay

## 2020-07-23 ENCOUNTER — Ambulatory Visit (INDEPENDENT_AMBULATORY_CARE_PROVIDER_SITE_OTHER): Payer: Self-pay | Admitting: Pharmacist

## 2020-07-23 VITALS — BP 114/80 | HR 79

## 2020-07-23 DIAGNOSIS — I255 Ischemic cardiomyopathy: Secondary | ICD-10-CM

## 2020-07-23 DIAGNOSIS — I10 Essential (primary) hypertension: Secondary | ICD-10-CM

## 2020-07-23 DIAGNOSIS — I25118 Atherosclerotic heart disease of native coronary artery with other forms of angina pectoris: Secondary | ICD-10-CM

## 2020-07-23 DIAGNOSIS — I5042 Chronic combined systolic (congestive) and diastolic (congestive) heart failure: Secondary | ICD-10-CM

## 2020-07-23 DIAGNOSIS — I2542 Coronary artery dissection: Secondary | ICD-10-CM

## 2020-07-23 DIAGNOSIS — I2121 ST elevation (STEMI) myocardial infarction involving left circumflex coronary artery: Secondary | ICD-10-CM

## 2020-07-23 DIAGNOSIS — I2510 Atherosclerotic heart disease of native coronary artery without angina pectoris: Secondary | ICD-10-CM

## 2020-07-23 MED ORDER — SPIRONOLACTONE 25 MG PO TABS: 25.0000 mg | ORAL_TABLET | Freq: Every day | ORAL | 3 refills | Status: DC

## 2020-07-23 MED ORDER — CARVEDILOL 12.5 MG PO TABS: 12.5000 mg | ORAL_TABLET | Freq: Two times a day (BID) | ORAL | 3 refills | Status: DC

## 2020-07-23 MED ORDER — AMLODIPINE BESYLATE 10 MG PO TABS: 10.0000 mg | ORAL_TABLET | Freq: Every day | ORAL | 3 refills | Status: DC

## 2020-07-23 MED ORDER — CLOPIDOGREL BISULFATE 75 MG PO TABS: 75.0000 mg | ORAL_TABLET | Freq: Every day | ORAL | 3 refills | Status: DC

## 2020-07-23 MED ORDER — ATORVASTATIN CALCIUM 40 MG PO TABS: 40.0000 mg | ORAL_TABLET | Freq: Every day | ORAL | 3 refills | Status: DC

## 2020-07-23 MED ORDER — ISOSORBIDE MONONITRATE ER 30 MG PO TB24: 30.0000 mg | ORAL_TABLET | Freq: Every day | ORAL | 3 refills | Status: DC

## 2020-07-23 MED ORDER — LOSARTAN POTASSIUM 100 MG PO TABS: 100.0000 mg | ORAL_TABLET | Freq: Every day | ORAL | 3 refills | Status: DC

## 2020-07-23 NOTE — Patient Instructions (Addendum)
It was nice to see you today!  Your blood pressure is excellent today and is at goal < 130/59mmHg  Continue taking your current medications  Limit your sodium intake to < 2,000mg  daily  Follow up as needed for blood pressure - call clinic if your readings stay consistently elevated

## 2020-07-24 LAB — BASIC METABOLIC PANEL
BUN/Creatinine Ratio: 8 — ABNORMAL LOW (ref 9–20)
BUN: 8 mg/dL (ref 6–20)
CO2: 24 mmol/L (ref 20–29)
Calcium: 9.9 mg/dL (ref 8.7–10.2)
Chloride: 101 mmol/L (ref 96–106)
Creatinine, Ser: 1.05 mg/dL (ref 0.76–1.27)
GFR calc Af Amer: 110 mL/min/{1.73_m2} (ref >59)
GFR calc non Af Amer: 95 mL/min/{1.73_m2} (ref >59)
Glucose: 99 mg/dL (ref 65–99)
Potassium: 4 mmol/L (ref 3.5–5.2)
Sodium: 139 mmol/L (ref 134–144)

## 2020-08-22 MED FILL — AMLODIPINE BESYLATE 10 MG T: 10 | 30 days supply | Qty: 30 | Fill #0

## 2020-08-22 MED FILL — CARVEDILOL 12.5 MG TABLET: 12.5 | 30 days supply | Qty: 60 | Fill #0

## 2020-08-22 MED FILL — LOSARTAN POTASSIUM 100 MG T: 100 | 30 days supply | Qty: 30 | Fill #0

## 2020-08-22 MED FILL — CLOPIDOGREL 75 MG TABLET: 75 | 30 days supply | Qty: 30 | Fill #0

## 2020-08-22 MED FILL — SPIRONOLACTONE 25 MG TABLET: 25 | 30 days supply | Qty: 30 | Fill #0

## 2020-08-22 MED FILL — ISOSORBIDE MN ER 30 MG TAB: 30 | 30 days supply | Qty: 30 | Fill #0

## 2020-08-29 ENCOUNTER — Ambulatory Visit: Payer: Self-pay | Admitting: Cardiology

## 2020-09-03 ENCOUNTER — Ambulatory Visit: Payer: Self-pay | Admitting: Cardiology

## 2020-09-23 MED FILL — AMLODIPINE BESYLATE 10 MG T: 10 | 30 days supply | Qty: 30 | Fill #1

## 2020-09-23 MED FILL — LOSARTAN POTASSIUM 100 MG T: 100 | 30 days supply | Qty: 30 | Fill #1

## 2020-09-23 MED FILL — ?SPIRONOLACTONE 25 MG TABLE: 25 | 30 days supply | Qty: 30 | Fill #1

## 2020-09-23 MED FILL — ISOSORBIDE MN ER 30 MG TAB: 30 | 30 days supply | Qty: 30 | Fill #1

## 2020-09-23 MED FILL — ?CLOPIDOGREL 75MG TA: 75 | 30 days supply | Qty: 30 | Fill #1

## 2020-10-23 MED FILL — ?SPIRONOLACTONE 25 MG TABLE: 25 | 30 days supply | Qty: 30 | Fill #2

## 2020-10-23 MED FILL — LOSARTAN POTASSIUM 100 MG T: 100 | 30 days supply | Qty: 30 | Fill #2

## 2020-10-23 MED FILL — AMLODIPINE BESYLATE 10 MG T: 10 | 30 days supply | Qty: 30 | Fill #2

## 2020-10-23 MED FILL — ?ATORVASTATIN 40MG TABLET: 40 | 30 days supply | Qty: 30 | Fill #2

## 2020-10-23 MED FILL — ?CLOPIDOGREL 75MG TA: 75 | 30 days supply | Qty: 30 | Fill #2

## 2020-10-23 MED FILL — ?CARVEDILOL 12.5 MG TABLET: 12.5 | 30 days supply | Qty: 60 | Fill #1

## 2020-10-23 MED FILL — ISOSORBIDE MN ER 30 MG TAB: 30 | 30 days supply | Qty: 30 | Fill #2

## 2020-10-28 MED FILL — ?CARVEDILOL 12.5 MG TABLET: 12.5 | 30 days supply | Qty: 60 | Fill #1

## 2020-12-05 ENCOUNTER — Other Ambulatory Visit: Payer: Self-pay | Admitting: Critical Care Medicine

## 2020-12-05 DIAGNOSIS — I25118 Atherosclerotic heart disease of native coronary artery with other forms of angina pectoris: Secondary | ICD-10-CM

## 2020-12-05 DIAGNOSIS — I2121 ST elevation (STEMI) myocardial infarction involving left circumflex coronary artery: Secondary | ICD-10-CM

## 2020-12-05 DIAGNOSIS — I10 Essential (primary) hypertension: Secondary | ICD-10-CM

## 2020-12-05 MED ORDER — ASPIRIN 81 MG PO CHEW: 81.0000 mg | CHEWABLE_TABLET | Freq: Every day | ORAL | 0 refills | Status: DC

## 2020-12-05 NOTE — Telephone Encounter (Signed)
Medication Refill - Medication: amLODipine (NORVASC) 10 MG tablet  aspirin 81 MG chewable tablet  atorvastatin (LIPITOR) 40 MG tablet  carvedilol (COREG) 12.5 MG tablet  clopidogrel (PLAVIX) 75 MG tablet  isosorbide mononitrate (IMDUR) 30 MG 24 hr tablet  losartan (COZAAR) 100 MG tablet     Preferred Pharmacy (with phone number or street name):  [facility] [place] [place] [facility] [place] [address] Phone:  [phone]  Fax:  [phone]       Agent: Please be advised that RX refills may take up to 3 business days. We ask that you follow-up with your pharmacy.

## 2020-12-10 MED FILL — AMLODIPINE BESYLATE 10 MG T: 10 | 30 days supply | Qty: 30 | Fill #3

## 2020-12-10 MED FILL — ?SPIRONOLACTONE 25 MG TABLE: 25 | 30 days supply | Qty: 30 | Fill #3

## 2020-12-10 MED FILL — ?CARVEDILOL 12.5 MG TABLET: 12.5 | 30 days supply | Qty: 60 | Fill #2

## 2020-12-10 MED FILL — ?ATORVASTATIN 40MG TABLET: 40 | 30 days supply | Qty: 30 | Fill #0

## 2020-12-10 MED FILL — ISOSORBIDE MN ER 30 MG TAB: 30 | 30 days supply | Qty: 30 | Fill #3

## 2020-12-10 MED FILL — ?CLOPIDOGREL 75 MG TABL: 75 | 30 days supply | Qty: 30 | Fill #3

## 2020-12-12 MED FILL — LOSARTAN POTASSIUM 100 MG T: 100 | 30 days supply | Qty: 30 | Fill #3
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
## 2020-12-16 ENCOUNTER — Ambulatory Visit: Payer: Self-pay | Admitting: Cardiology

## 2020-12-17 MED FILL — LOSARTAN POTASSIUM 100 MG T: 100 | 30 days supply | Qty: 30 | Fill #3

## 2020-12-17 MED FILL — NITROGLYCERIN 0.4 MG TAB SL: 0.4 | 15 days supply | Qty: 25 | Fill #0

## 2020-12-17 MED FILL — AMLODIPINE BESYLATE 10 MG T: 10 | 30 days supply | Qty: 30 | Fill #3

## 2020-12-17 MED FILL — ?CARVEDILOL 12.5 MG TABLET: 12.5 | 30 days supply | Qty: 60 | Fill #2

## 2020-12-17 MED FILL — ?ATORVASTATIN 40MG TABLET: 40 | 30 days supply | Qty: 30 | Fill #0

## 2020-12-17 MED FILL — ?CLOPIDOGREL 75 MG TABL: 75 | 30 days supply | Qty: 30 | Fill #3

## 2020-12-17 MED FILL — ?SPIRONOLACTONE 25 MG TABLE: 25 | 30 days supply | Qty: 30 | Fill #3

## 2020-12-17 MED FILL — ISOSORBIDE MN ER 30 MG TAB: 30 | 30 days supply | Qty: 30 | Fill #3

## 2021-02-25 ENCOUNTER — Other Ambulatory Visit (HOSPITAL_COMMUNITY): Payer: Self-pay

## 2021-02-25 MED FILL — Losartan Potassium Tab 100 MG: ORAL | 90 days supply | Qty: 90 | Fill #0 | Status: AC

## 2021-03-05 ENCOUNTER — Ambulatory Visit: Payer: Self-pay | Admitting: Physician Assistant

## 2021-03-05 ENCOUNTER — Other Ambulatory Visit: Payer: Self-pay

## 2021-03-05 ENCOUNTER — Observation Stay (HOSPITAL_COMMUNITY)
Admission: EM | Admit: 2021-03-05 | Discharge: 2021-03-06 | Disposition: A | Discharge status: Home / Self Care | Payer: No Typology Code available for payment source | Attending: Cardiology | Admitting: Cardiology

## 2021-03-05 ENCOUNTER — Emergency Department (HOSPITAL_COMMUNITY): Payer: No Typology Code available for payment source

## 2021-03-05 ENCOUNTER — Encounter (HOSPITAL_COMMUNITY): Payer: Self-pay | Admitting: Cardiology

## 2021-03-05 ENCOUNTER — Telehealth: Payer: Self-pay | Admitting: Cardiology

## 2021-03-05 DIAGNOSIS — Z87891 Personal history of nicotine dependence: Secondary | ICD-10-CM | POA: Diagnosis not present

## 2021-03-05 DIAGNOSIS — I200 Unstable angina: Secondary | ICD-10-CM | POA: Diagnosis not present

## 2021-03-05 DIAGNOSIS — Z7982 Long term (current) use of aspirin: Secondary | ICD-10-CM | POA: Diagnosis not present

## 2021-03-05 DIAGNOSIS — I213 ST elevation (STEMI) myocardial infarction of unspecified site: Secondary | ICD-10-CM | POA: Diagnosis present

## 2021-03-05 DIAGNOSIS — R079 Chest pain, unspecified: Secondary | ICD-10-CM | POA: Diagnosis present

## 2021-03-05 DIAGNOSIS — Z79899 Other long term (current) drug therapy: Secondary | ICD-10-CM | POA: Diagnosis not present

## 2021-03-05 DIAGNOSIS — I5042 Chronic combined systolic (congestive) and diastolic (congestive) heart failure: Secondary | ICD-10-CM | POA: Diagnosis not present

## 2021-03-05 DIAGNOSIS — I25110 Atherosclerotic heart disease of native coronary artery with unstable angina pectoris: Principal | ICD-10-CM | POA: Insufficient documentation

## 2021-03-05 DIAGNOSIS — Z20822 Contact with and (suspected) exposure to covid-19: Secondary | ICD-10-CM | POA: Diagnosis not present

## 2021-03-05 DIAGNOSIS — I110 Hypertensive heart disease with heart failure: Secondary | ICD-10-CM | POA: Insufficient documentation

## 2021-03-05 DIAGNOSIS — Z7902 Long term (current) use of antithrombotics/antiplatelets: Secondary | ICD-10-CM | POA: Insufficient documentation

## 2021-03-05 DIAGNOSIS — I10 Essential (primary) hypertension: Secondary | ICD-10-CM | POA: Diagnosis present

## 2021-03-05 DIAGNOSIS — J45909 Unspecified asthma, uncomplicated: Secondary | ICD-10-CM | POA: Insufficient documentation

## 2021-03-05 DIAGNOSIS — R0789 Other chest pain: Secondary | ICD-10-CM | POA: Diagnosis present

## 2021-03-05 DIAGNOSIS — I2542 Coronary artery dissection: Secondary | ICD-10-CM | POA: Diagnosis present

## 2021-03-05 DIAGNOSIS — I240 Acute coronary thrombosis not resulting in myocardial infarction: Secondary | ICD-10-CM | POA: Diagnosis present

## 2021-03-05 LAB — CREATININE, SERUM
Creatinine, Ser: 0.96 mg/dL (ref 0.61–1.24)
GFR, Estimated: >60 mL/min (ref >60)

## 2021-03-05 LAB — HEMOGLOBIN A1C
Hgb A1c MFr Bld: 5.8 % — ABNORMAL HIGH (ref 4.8–5.6)
Mean Plasma Glucose: 119.76 mg/dL

## 2021-03-05 LAB — BASIC METABOLIC PANEL
Anion gap: 8 (ref 5–15)
BUN: 7 mg/dL (ref 6–20)
CO2: 24 mmol/L (ref 22–32)
Calcium: 9.2 mg/dL (ref 8.9–10.3)
Chloride: 103 mmol/L (ref 98–111)
Creatinine, Ser: 1.04 mg/dL (ref 0.61–1.24)
GFR, Estimated: >60 mL/min (ref >60)
Glucose, Bld: 114 mg/dL — ABNORMAL HIGH (ref 70–99)
Potassium: 3.6 mmol/L (ref 3.5–5.1)
Sodium: 135 mmol/L (ref 135–145)

## 2021-03-05 LAB — CBC
HCT: 42.9 % (ref 39.0–52.0)
HCT: 38.9 % — ABNORMAL LOW (ref 39.0–52.0)
Hemoglobin: 14.7 g/dL (ref 13.0–17.0)
Hemoglobin: 13.4 g/dL (ref 13.0–17.0)
MCH: 31.3 pg (ref 26.0–34.0)
MCH: 31.2 pg (ref 26.0–34.0)
MCHC: 34.3 g/dL (ref 30.0–36.0)
MCHC: 34.4 g/dL (ref 30.0–36.0)
MCV: 91.3 fL (ref 80.0–100.0)
MCV: 90.7 fL (ref 80.0–100.0)
Platelets: 326 10*3/uL (ref 150–400)
Platelets: 286 10*3/uL (ref 150–400)
RBC: 4.7 MIL/uL (ref 4.22–5.81)
RBC: 4.29 MIL/uL (ref 4.22–5.81)
RDW: 12.7 % (ref 11.5–15.5)
RDW: 12.9 % (ref 11.5–15.5)
WBC: 5.7 10*3/uL (ref 4.0–10.5)
WBC: 6.1 10*3/uL (ref 4.0–10.5)
nRBC: 0 % (ref 0.0–0.2)
nRBC: 0 % (ref 0.0–0.2)

## 2021-03-05 LAB — HEPARIN LEVEL (UNFRACTIONATED): Heparin Unfractionated: 0.31 IU/mL (ref 0.30–0.70)

## 2021-03-05 LAB — HEPATIC FUNCTION PANEL
ALT: 30 U/L (ref 0–44)
AST: 28 U/L (ref 15–41)
Albumin: 4.1 g/dL (ref 3.5–5.0)
Alkaline Phosphatase: 60 U/L (ref 38–126)
Bilirubin, Direct: <0.1 mg/dL (ref 0.0–0.2)
Total Bilirubin: 0.5 mg/dL (ref 0.3–1.2)
Total Protein: 7 g/dL (ref 6.5–8.1)

## 2021-03-05 LAB — TROPONIN I (HIGH SENSITIVITY)
Troponin I (High Sensitivity): 7 ng/L (ref <18)
Troponin I (High Sensitivity): 6 ng/L (ref <18)

## 2021-03-05 LAB — MAGNESIUM: Magnesium: 1.9 mg/dL (ref 1.7–2.4)

## 2021-03-05 LAB — D-DIMER, QUANTITATIVE: D-Dimer, Quant: <0.27 ug/mL-FEU (ref 0.00–0.50)

## 2021-03-05 LAB — HIV ANTIBODY (ROUTINE TESTING W REFLEX): HIV Screen 4th Generation wRfx: NONREACTIVE

## 2021-03-05 LAB — T4, FREE: Free T4: 0.85 ng/dL (ref 0.61–1.12)

## 2021-03-05 LAB — TSH: TSH: 2.861 u[IU]/mL (ref 0.350–4.500)

## 2021-03-05 LAB — SARS CORONAVIRUS 2 (TAT 6-24 HRS): SARS Coronavirus 2: NEGATIVE

## 2021-03-05 MED ORDER — HEPARIN BOLUS VIA INFUSION
4000.0000 [IU] | Freq: Once | INTRAVENOUS | Status: AC
  Administered 2021-03-05: 4000 [IU] via INTRAVENOUS
  Filled 2021-03-05: qty 4000

## 2021-03-05 MED ORDER — LOSARTAN POTASSIUM 50 MG PO TABS
100.0000 mg | ORAL_TABLET | Freq: Every day | ORAL | Status: DC
  Administered 2021-03-06: 100 mg via ORAL
  Filled 2021-03-05: qty 2

## 2021-03-05 MED ORDER — SODIUM CHLORIDE 0.9 % WEIGHT BASED INFUSION
3.0000 mL/kg/h | INTRAVENOUS | Status: DC
  Administered 2021-03-06: 3 mL/kg/h via INTRAVENOUS

## 2021-03-05 MED ORDER — ASPIRIN 81 MG PO CHEW
324.0000 mg | CHEWABLE_TABLET | Freq: Once | ORAL | Status: AC
  Administered 2021-03-05: 324 mg via ORAL
  Filled 2021-03-05: qty 4

## 2021-03-05 MED ORDER — ASPIRIN 81 MG PO CHEW
81.0000 mg | CHEWABLE_TABLET | Freq: Every day | ORAL | Status: DC
  Filled 2021-03-05: qty 1

## 2021-03-05 MED ORDER — SODIUM CHLORIDE 0.9% FLUSH: 3.0000 mL | INTRAVENOUS | Status: DC | PRN

## 2021-03-05 MED ORDER — AMLODIPINE BESYLATE 10 MG PO TABS
10.0000 mg | ORAL_TABLET | Freq: Every day | ORAL | Status: DC
  Administered 2021-03-06: 10 mg via ORAL
  Filled 2021-03-05: qty 1

## 2021-03-05 MED ORDER — ASPIRIN 300 MG RE SUPP: 300.0000 mg | RECTAL | Status: DC

## 2021-03-05 MED ORDER — NITROGLYCERIN IN D5W 200-5 MCG/ML-% IV SOLN
0.0000 ug/min | INTRAVENOUS | Status: DC
  Administered 2021-03-05: 5 ug/min via INTRAVENOUS
  Filled 2021-03-05: qty 250

## 2021-03-05 MED ORDER — SODIUM CHLORIDE 0.9 % WEIGHT BASED INFUSION
1.0000 mL/kg/h | INTRAVENOUS | Status: DC
  Administered 2021-03-06: 1 mL/kg/h via INTRAVENOUS

## 2021-03-05 MED ORDER — ACETAMINOPHEN 325 MG PO TABS: 650.0000 mg | ORAL_TABLET | ORAL | Status: DC | PRN

## 2021-03-05 MED ORDER — SODIUM CHLORIDE 0.9 % IV SOLN: INTRAVENOUS | Status: DC

## 2021-03-05 MED ORDER — ZOLPIDEM TARTRATE 5 MG PO TABS: 5.0000 mg | ORAL_TABLET | Freq: Every evening | ORAL | Status: DC | PRN

## 2021-03-05 MED ORDER — ATORVASTATIN CALCIUM 40 MG PO TABS
40.0000 mg | ORAL_TABLET | Freq: Every day | ORAL | Status: DC
  Administered 2021-03-05: 40 mg via ORAL
  Filled 2021-03-05: qty 1

## 2021-03-05 MED ORDER — ASPIRIN 81 MG PO CHEW
81.0000 mg | CHEWABLE_TABLET | ORAL | Status: AC
  Administered 2021-03-06: 81 mg via ORAL
  Filled 2021-03-05: qty 1

## 2021-03-05 MED ORDER — SODIUM CHLORIDE 0.9 % IV SOLN: 250.0000 mL | INTRAVENOUS | Status: DC | PRN

## 2021-03-05 MED ORDER — HEPARIN (PORCINE) 25000 UT/250ML-% IV SOLN
1000.0000 [IU]/h | INTRAVENOUS | Status: DC
  Administered 2021-03-05: 1000 [IU]/h via INTRAVENOUS
  Filled 2021-03-05: qty 250

## 2021-03-05 MED ORDER — NITROGLYCERIN 0.4 MG SL SUBL: 0.4000 mg | SUBLINGUAL_TABLET | SUBLINGUAL | Status: DC | PRN

## 2021-03-05 MED ORDER — SPIRONOLACTONE 25 MG PO TABS
25.0000 mg | ORAL_TABLET | Freq: Every day | ORAL | Status: DC
  Administered 2021-03-06: 25 mg via ORAL
  Filled 2021-03-05: qty 1

## 2021-03-05 MED ORDER — ALPRAZOLAM 0.25 MG PO TABS: 0.2500 mg | ORAL_TABLET | Freq: Two times a day (BID) | ORAL | Status: DC | PRN

## 2021-03-05 MED ORDER — ASPIRIN 81 MG PO CHEW: 324.0000 mg | CHEWABLE_TABLET | ORAL | Status: DC

## 2021-03-05 MED ORDER — CARVEDILOL 12.5 MG PO TABS
12.5000 mg | ORAL_TABLET | Freq: Two times a day (BID) | ORAL | Status: DC
  Administered 2021-03-05 – 2021-03-06 (×2): 12.5 mg via ORAL
  Filled 2021-03-05 (×2): qty 1

## 2021-03-05 MED ORDER — SODIUM CHLORIDE 0.9% FLUSH
3.0000 mL | Freq: Two times a day (BID) | INTRAVENOUS | Status: DC
  Administered 2021-03-06 (×2): 3 mL via INTRAVENOUS

## 2021-03-05 MED ORDER — ONDANSETRON HCL 4 MG/2ML IJ SOLN: 4.0000 mg | Freq: Four times a day (QID) | INTRAMUSCULAR | Status: DC | PRN
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
## 2021-03-05 NOTE — ED Notes (Signed)
Nurse report given at this time.
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
## 2021-03-05 NOTE — Telephone Encounter (Signed)
Provider contacted me back in regards to this pt coming into the office to be seen today.  Per Ronie Spies PA-C, being the pt has a very complicated cardiac history, he should refer to the ER now, for full chest pain work-up and testing.  Called the pt and informed him of this.  Endorsed to him with his extensive cardiac history, and possibly needing another cath, the Provider recommends that he go to the ER now for time sensitive testing and chest pain work-up.  Advised him to go there now.  Did inform him that I will call our Cardmaster to make her aware he is in route to [facility] ER, for complaints of intermittent chest pain, and as advised by our office. Pt verbalized understanding and agrees with this plan.  Pt will report to the ER now, and does have someone driving him. Pt appreciative for all the assistance provided.   Spoke with Daun Peacock about pt coming to the ER now, as advised by our office, for chest pain.  Trish verbalized understanding, and took down his MRN #.
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
## 2021-03-05 NOTE — ED Notes (Signed)
Family updated as to patient's status.
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
## 2021-03-05 NOTE — ED Notes (Signed)
Vital signs stable. 
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
## 2021-03-05 NOTE — Telephone Encounter (Signed)
Pt c/o of Chest Pain: STAT if CP now or developed within 24 hours  1. Are you having CP right now? Yes   2. Are you experiencing any other symptoms (ex. SOB, nausea, vomiting, sweating)? A little SOB as if his breathe is being taken away when CP is severe.   3. How long have you been experiencing CP? For the past 3 days   4. Is your CP continuous or coming and going? Coming and going   5. Have you taken Nitroglycerin? Yes, took about 4 on 03/03/21 and 2 on 03/02/21. States it did not help with CP. Has not taken any since due to not working.    Is laying down, states it hurts really bad when he gets up. Some times it is sharp pain and other times it is just pressure. Is requesting an appt to be seen in regards to it.  ?
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
## 2021-03-06 ENCOUNTER — Encounter (HOSPITAL_COMMUNITY): Payer: Self-pay | Admitting: Cardiology

## 2021-03-06 ENCOUNTER — Telehealth: Payer: Self-pay | Admitting: Physician Assistant

## 2021-03-06 ENCOUNTER — Ambulatory Visit (HOSPITAL_COMMUNITY)
Admission: EM | Disposition: A | Discharge status: Home / Self Care | Payer: Self-pay | Source: Home / Self Care | Attending: Emergency Medicine

## 2021-03-06 ENCOUNTER — Observation Stay (HOSPITAL_BASED_OUTPATIENT_CLINIC_OR_DEPARTMENT_OTHER): Payer: No Typology Code available for payment source

## 2021-03-06 ENCOUNTER — Other Ambulatory Visit (HOSPITAL_COMMUNITY): Payer: Self-pay

## 2021-03-06 DIAGNOSIS — I2510 Atherosclerotic heart disease of native coronary artery without angina pectoris: Secondary | ICD-10-CM

## 2021-03-06 DIAGNOSIS — I259 Chronic ischemic heart disease, unspecified: Secondary | ICD-10-CM | POA: Diagnosis not present

## 2021-03-06 DIAGNOSIS — R079 Chest pain, unspecified: Secondary | ICD-10-CM | POA: Diagnosis not present

## 2021-03-06 DIAGNOSIS — I10 Essential (primary) hypertension: Secondary | ICD-10-CM

## 2021-03-06 HISTORY — PX: LEFT HEART CATH AND CORONARY ANGIOGRAPHY: CATH118249

## 2021-03-06 LAB — LIPID PANEL
Cholesterol: 169 mg/dL (ref 0–200)
HDL: 60 mg/dL (ref >40)
LDL Cholesterol: 94 mg/dL (ref 0–99)
Total CHOL/HDL Ratio: 2.8 RATIO
Triglycerides: 76 mg/dL (ref <150)
VLDL: 15 mg/dL (ref 0–40)

## 2021-03-06 LAB — ECHOCARDIOGRAM COMPLETE
AR max vel: 4.23 cm2
AV Area VTI: 3.65 cm2
AV Area mean vel: 4.33 cm2
AV Mean grad: 2 mmHg
AV Peak grad: 3.5 mmHg
Ao pk vel: 0.93 m/s
Area-P 1/2: 3.31 cm2
Height: 66 in
MV VTI: 2.85 cm2
S' Lateral: 3.3 cm
Weight: 3368 oz

## 2021-03-06 LAB — CBC
HCT: 40.6 % (ref 39.0–52.0)
Hemoglobin: 14.1 g/dL (ref 13.0–17.0)
MCH: 31.5 pg (ref 26.0–34.0)
MCHC: 34.7 g/dL (ref 30.0–36.0)
MCV: 90.6 fL (ref 80.0–100.0)
Platelets: 276 10*3/uL (ref 150–400)
RBC: 4.48 MIL/uL (ref 4.22–5.81)
RDW: 12.7 % (ref 11.5–15.5)
WBC: 6.2 10*3/uL (ref 4.0–10.5)
nRBC: 0 % (ref 0.0–0.2)

## 2021-03-06 LAB — BASIC METABOLIC PANEL
Anion gap: 7 (ref 5–15)
BUN: 6 mg/dL (ref 6–20)
CO2: 27 mmol/L (ref 22–32)
Calcium: 9.1 mg/dL (ref 8.9–10.3)
Chloride: 103 mmol/L (ref 98–111)
Creatinine, Ser: 1.04 mg/dL (ref 0.61–1.24)
GFR, Estimated: >60 mL/min (ref >60)
Glucose, Bld: 101 mg/dL — ABNORMAL HIGH (ref 70–99)
Potassium: 3.7 mmol/L (ref 3.5–5.1)
Sodium: 137 mmol/L (ref 135–145)

## 2021-03-06 LAB — HEPARIN LEVEL (UNFRACTIONATED): Heparin Unfractionated: 0.25 IU/mL — ABNORMAL LOW (ref 0.30–0.70)

## 2021-03-06 SURGERY — LEFT HEART CATH AND CORONARY ANGIOGRAPHY
Anesthesia: LOCAL

## 2021-03-06 MED ORDER — IOHEXOL 350 MG/ML SOLN
INTRAVENOUS | Status: DC | PRN
  Administered 2021-03-06: 60 mL

## 2021-03-06 MED ORDER — SODIUM CHLORIDE 0.9% FLUSH: 3.0000 mL | Freq: Two times a day (BID) | INTRAVENOUS | Status: DC

## 2021-03-06 MED ORDER — HEPARIN (PORCINE) IN NACL 1000-0.9 UT/500ML-% IV SOLN
INTRAVENOUS | Status: AC
  Filled 2021-03-06: qty 1000

## 2021-03-06 MED ORDER — ISOSORBIDE MONONITRATE ER 30 MG PO TB24
30.0000 mg | ORAL_TABLET | Freq: Every day | ORAL | Status: DC
  Administered 2021-03-06: 30 mg via ORAL
  Filled 2021-03-06: qty 1

## 2021-03-06 MED ORDER — SODIUM CHLORIDE 0.9 % WEIGHT BASED INFUSION: 1.0000 mL/kg/h | INTRAVENOUS | Status: AC

## 2021-03-06 MED ORDER — FENTANYL CITRATE (PF) 100 MCG/2ML IJ SOLN
INTRAMUSCULAR | Status: DC | PRN
  Administered 2021-03-06: 25 ug via INTRAVENOUS

## 2021-03-06 MED ORDER — EZETIMIBE 10 MG PO TABS
10.0000 mg | ORAL_TABLET | Freq: Every day | ORAL | Status: DC
  Administered 2021-03-06: 10 mg via ORAL
  Filled 2021-03-06: qty 1

## 2021-03-06 MED ORDER — LIDOCAINE HCL (PF) 1 % IJ SOLN
INTRAMUSCULAR | Status: DC | PRN
  Administered 2021-03-06: 2 mL

## 2021-03-06 MED ORDER — SODIUM CHLORIDE 0.9% FLUSH: 3.0000 mL | INTRAVENOUS | Status: DC | PRN

## 2021-03-06 MED ORDER — LIDOCAINE HCL (PF) 1 % IJ SOLN
INTRAMUSCULAR | Status: AC
  Filled 2021-03-06: qty 30

## 2021-03-06 MED ORDER — MIDAZOLAM HCL 2 MG/2ML IJ SOLN
INTRAMUSCULAR | Status: DC | PRN
  Administered 2021-03-06: 2 mg via INTRAVENOUS

## 2021-03-06 MED ORDER — TRAMADOL HCL 50 MG PO TABS
50.0000 mg | ORAL_TABLET | Freq: Four times a day (QID) | ORAL | Status: DC | PRN
  Filled 2021-03-06: qty 1

## 2021-03-06 MED ORDER — SODIUM CHLORIDE 0.9 % IV SOLN: 250.0000 mL | INTRAVENOUS | Status: DC | PRN

## 2021-03-06 MED ORDER — EZETIMIBE 10 MG PO TABS
10.0000 mg | ORAL_TABLET | Freq: Every day | ORAL | 2 refills | Status: DC
  Filled 2021-03-06: qty 90, 90d supply, fill #0
  Filled 2021-06-05 (×2): qty 90, 90d supply, fill #1
  Filled 2021-10-20: qty 30, 30d supply, fill #2

## 2021-03-06 MED ORDER — HYDRALAZINE HCL 20 MG/ML IJ SOLN: 10.0000 mg | INTRAMUSCULAR | Status: AC | PRN

## 2021-03-06 MED ORDER — VERAPAMIL HCL 2.5 MG/ML IV SOLN
INTRAVENOUS | Status: DC | PRN
  Administered 2021-03-06: 10 mL via INTRA_ARTERIAL

## 2021-03-06 MED ORDER — HEPARIN (PORCINE) IN NACL 1000-0.9 UT/500ML-% IV SOLN
INTRAVENOUS | Status: DC | PRN
  Administered 2021-03-06 (×3): 500 mL

## 2021-03-06 MED ORDER — FENTANYL CITRATE (PF) 100 MCG/2ML IJ SOLN
INTRAMUSCULAR | Status: AC
  Filled 2021-03-06: qty 2

## 2021-03-06 MED ORDER — HEPARIN SODIUM (PORCINE) 1000 UNIT/ML IJ SOLN
INTRAMUSCULAR | Status: DC | PRN
  Administered 2021-03-06: 5000 [IU] via INTRAVENOUS

## 2021-03-06 MED ORDER — PANTOPRAZOLE SODIUM 40 MG PO TBEC
40.0000 mg | DELAYED_RELEASE_TABLET | Freq: Every day | ORAL | Status: DC
  Administered 2021-03-06: 40 mg via ORAL
  Filled 2021-03-06: qty 1

## 2021-03-06 MED ORDER — MIDAZOLAM HCL 2 MG/2ML IJ SOLN
INTRAMUSCULAR | Status: AC
  Filled 2021-03-06: qty 2

## 2021-03-06 MED ORDER — HEPARIN SODIUM (PORCINE) 1000 UNIT/ML IJ SOLN
INTRAMUSCULAR | Status: AC
  Filled 2021-03-06: qty 1

## 2021-03-06 MED ORDER — VERAPAMIL HCL 2.5 MG/ML IV SOLN
INTRAVENOUS | Status: AC
  Filled 2021-03-06: qty 2

## 2021-03-06 SURGICAL SUPPLY — 9 items

## 2021-03-06 NOTE — Care Management (Signed)
03-06-21 1442 Patient had questions regarding disability. Case Manager was able to speak with the patient and spouse regarding disability. Patient is post cardiac cath-plan to medical manage. Patient will need to start at [employer]. A review of medical history will be obtained to see if the patient qualifies for disability. No further needs from Case Manager at this time. Gala Lewandowsky , RN,BSN Case Manager
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
## 2021-03-06 NOTE — Interval H&P Note (Signed)
History and Physical Interval Note:  03/06/2021 8:19 AM  [name]  has presented today for surgery, with the diagnosis of CHEST PAIN.  The various methods of treatment have been discussed with the patient and family. After consideration of risks, benefits and other options for treatment, the patient has consented to  Procedure(s): LEFT HEART CATH AND CORONARY ANGIOGRAPHY (N/A) as a surgical intervention.  The patient's history has been reviewed, patient examined, no change in status, stable for surgery.  I have reviewed the patient's chart and labs.  Questions were answered to the patient's satisfaction.   Cath Lab Visit (complete for each Cath Lab visit)  Clinical Evaluation Leading to the Procedure:   ACS: Yes.    Non-ACS:    Anginal Classification: CCS IV  Anti-ischemic medical therapy: Maximal Therapy (2 or more classes of medications)  Non-Invasive Test Results: No non-invasive testing performed  Prior CABG: No previous CABG        Theron Arista [facility] 03/06/2021 8:20 AM

## 2021-03-06 NOTE — Telephone Encounter (Signed)
Per Georgie Chard, set up [facility] visit on 04/10/21 at 8:45 am with Tereso Newcomer.

## 2021-03-06 NOTE — Progress Notes (Signed)
  Echocardiogram 2D Echocardiogram has been performed.  [name] 03/06/2021, 3:27 PM
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
## 2021-03-09 ENCOUNTER — Other Ambulatory Visit: Payer: Self-pay | Admitting: *Deleted

## 2021-03-09 ENCOUNTER — Encounter: Payer: Self-pay | Admitting: *Deleted

## 2021-03-09 MED FILL — Lidocaine HCl Local Preservative Free (PF) Inj 1%: INTRAMUSCULAR | Qty: 30 | Status: AC

## 2021-03-09 NOTE — Patient Outreach (Addendum)
[facility]) Care Management  03/09/2021  [name] June 29, 1991 [id number]   Transition of care call/case closure   Referral received:03/09/21 Initial outreach:03/09/21 Insurance: [place] [employer]    Subjective: Initial successful telephone call to patient's preferred number in order to complete transition of care assessment; Spoke with patient wife [name], DPR   2 HIPAA identifiers verified. Explained purpose of call and completed transition of care assessment.  Wife states patient is doing fine, she discussed that he has denied chest pain , shortness of breath, independent with care. He is  tolerating diet, denies bowel or bladder problems.  Spouse is  assisting with his  recovery.  She reports that patient has been an patient at [facility] and wellness and visit in last year he sees cardiology on regular basis.   Reviewed accessing the following [place] Benefits : She discussed patient  ongoing health issues history of MI, hypertension  and says she declines need for a referral to one of the [place] chronic disease management programs. Emphasized no cost  And benefit of insurance plan she is agreeable to reviewing on [facility] wellness site.  Discussed wife benefit of [place] pharmacies having blood pressure monitor at a low cost.  Wife is unsure it she has  the hospital indemnity plan provided contact number to [employer] to follow up.  She uses a [facility] at [facility]   Objective:  [name]  was hospitalized at [facility] 5/5-03/06/21 for Chest pain, s/p Cardiac Cath per record no new disease to explain symptoms.   Comorbidities include: CAD, hypertension,   He  was discharged to home on 03/06/21  without the need for home health services or DME.   Assessment:  Patient voices good understanding of all discharge instructions.  See transition of care flowsheet for assessment details.   Plan:  Reviewed hospital  discharge diagnosis of Chest pain   and discharge treatment plan using hospital discharge instructions, assessing medication adherence, reviewing problems requiring provider notification, and discussing the importance of follow up with surgeon, primary care provider and/or specialists as directed.  Reviewed [place] healthy lifestyle program information to receive discounted premium for  2023   Step 1: Get  your annual physical  Step 2: Complete your health assessment  Step 3:Identify your current health status and complete the corresponding action step between November 01, 2020 and July 02, 2021.       No ongoing care management needs identified so will close case to [facility] Care Management services and route successful outreach letter with [facility] Care Management pamphlet and 24 Hour Nurse Line Magnet to [employer] Care Management clinical pool to be mailed to patient's home address.  Thanked patient wife  for their services to [facility].   Egbert Garibaldi, RN, BSN  [facility] Care Management,Care Management Coordinator  [phone]- Mobile [phone]- Toll Free Main Office

## 2021-03-09 NOTE — Telephone Encounter (Signed)
**Note De-Identified [name] Obfuscation** Patient contacted regarding discharge from [facility] on 03/06/2021.  Patient understands to follow up with provider Tereso Newcomer, PA-c on 04/10/2021 at 8:45 at [address] in [place], [place] [address].  Patient understands discharge instructions? Yes Patient understands medications and regiment? Yes Patient understands to bring all medications to this visit? Yes  Ask patient:  Are you enrolled in My Chart: Yes  The pt states that he is "doing well" and is without any c/o CP/discomfort, SOB, dizziness, lightheadedness, nausea ,or diaphoresis.  He thanked me for my call and he does have [facility]'s phone number to call if he has any questions or concerns.

## 2021-03-25 ENCOUNTER — Other Ambulatory Visit (HOSPITAL_COMMUNITY): Payer: Self-pay

## 2021-03-25 MED FILL — Nitroglycerin SL Tab 0.4 MG: SUBLINGUAL | 15 days supply | Qty: 25 | Fill #0 | Status: AC
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
## 2021-04-10 ENCOUNTER — Ambulatory Visit: Payer: No Typology Code available for payment source | Admitting: Physician Assistant

## 2021-05-07 ENCOUNTER — Other Ambulatory Visit: Payer: Self-pay | Admitting: Cardiology

## 2021-05-07 ENCOUNTER — Other Ambulatory Visit (HOSPITAL_COMMUNITY): Payer: Self-pay

## 2021-05-07 MED ORDER — CLOPIDOGREL BISULFATE 75 MG PO TABS
75.0000 mg | ORAL_TABLET | Freq: Every day | ORAL | 0 refills | Status: DC
  Filled 2021-05-07: qty 90, 90d supply, fill #0

## 2021-05-07 MED ORDER — ISOSORBIDE MONONITRATE ER 30 MG PO TB24
30.0000 mg | ORAL_TABLET | Freq: Three times a day (TID) | ORAL | 0 refills | Status: DC | PRN
  Filled 2021-05-07: qty 90, 30d supply, fill #0

## 2021-05-07 MED ORDER — ATORVASTATIN CALCIUM 40 MG PO TABS
40.0000 mg | ORAL_TABLET | Freq: Every day | ORAL | 0 refills | Status: DC
  Filled 2021-05-07: qty 90, 90d supply, fill #0

## 2021-05-07 MED ORDER — LOSARTAN POTASSIUM 100 MG PO TABS
100.0000 mg | ORAL_TABLET | Freq: Every day | ORAL | 0 refills | Status: DC
  Filled 2021-05-07 – 2021-05-21 (×3): qty 90, 90d supply, fill #0
  Filled 2021-06-05: qty 30, 30d supply, fill #0
  Filled 2021-07-09: qty 30, 30d supply, fill #1
  Filled 2021-08-10: qty 30, 30d supply, fill #2

## 2021-05-07 MED ORDER — AMLODIPINE BESYLATE 10 MG PO TABS
10.0000 mg | ORAL_TABLET | Freq: Every day | ORAL | 0 refills | Status: DC
  Filled 2021-05-07: qty 90, 90d supply, fill #0

## 2021-05-07 MED ORDER — SPIRONOLACTONE 25 MG PO TABS
25.0000 mg | ORAL_TABLET | Freq: Every day | ORAL | 0 refills | Status: DC
  Filled 2021-05-07: qty 90, 90d supply, fill #0

## 2021-05-07 MED ORDER — CARVEDILOL 12.5 MG PO TABS
12.5000 mg | ORAL_TABLET | Freq: Two times a day (BID) | ORAL | 0 refills | Status: DC
  Filled 2021-05-07: qty 180, 90d supply, fill #0

## 2021-05-08 ENCOUNTER — Other Ambulatory Visit (HOSPITAL_COMMUNITY): Payer: Self-pay

## 2021-05-11 ENCOUNTER — Other Ambulatory Visit (HOSPITAL_COMMUNITY): Payer: Self-pay

## 2021-05-11 ENCOUNTER — Other Ambulatory Visit: Payer: Self-pay | Admitting: Physician Assistant

## 2021-05-18 ENCOUNTER — Other Ambulatory Visit (HOSPITAL_COMMUNITY): Payer: Self-pay

## 2021-05-21 ENCOUNTER — Other Ambulatory Visit: Payer: Self-pay

## 2021-05-21 ENCOUNTER — Other Ambulatory Visit (HOSPITAL_COMMUNITY): Payer: Self-pay

## 2021-05-21 ENCOUNTER — Other Ambulatory Visit: Payer: Self-pay | Admitting: Physician Assistant

## 2021-05-21 MED ORDER — ISOSORBIDE MONONITRATE ER 30 MG PO TB24
30.0000 mg | ORAL_TABLET | Freq: Every day | ORAL | 0 refills | Status: DC
  Filled 2021-05-21 – 2021-06-05 (×2): qty 90, 90d supply, fill #0

## 2021-05-21 MED ORDER — ISOSORBIDE MONONITRATE ER 30 MG PO TB24
30.0000 mg | ORAL_TABLET | Freq: Three times a day (TID) | ORAL | 0 refills | Status: DC | PRN
  Filled 2021-05-21: qty 90, 30d supply, fill #0

## 2021-05-21 MED ORDER — ISOSORBIDE MONONITRATE ER 30 MG PO TB24
30.0000 mg | ORAL_TABLET | Freq: Every day | ORAL | 0 refills | Status: DC
  Filled 2021-05-21: qty 90, 90d supply, fill #0

## 2021-05-22 ENCOUNTER — Other Ambulatory Visit (HOSPITAL_COMMUNITY): Payer: Self-pay

## 2021-05-29 ENCOUNTER — Other Ambulatory Visit (HOSPITAL_COMMUNITY): Payer: Self-pay

## 2021-06-05 ENCOUNTER — Other Ambulatory Visit: Payer: Self-pay

## 2021-06-08 ENCOUNTER — Other Ambulatory Visit: Payer: Self-pay

## 2021-06-08 MED ORDER — AMOXICILLIN 500 MG PO CAPS
ORAL_CAPSULE | ORAL | 0 refills | Status: DC
  Filled 2021-06-08: qty 28, 7d supply, fill #0

## 2021-06-23 ENCOUNTER — Emergency Department (HOSPITAL_COMMUNITY): Payer: Self-pay

## 2021-06-23 ENCOUNTER — Emergency Department (HOSPITAL_COMMUNITY)
Admission: EM | Admit: 2021-06-23 | Discharge: 2021-06-24 | Disposition: A | Discharge status: Home / Self Care | Payer: Self-pay | Attending: Emergency Medicine | Admitting: Emergency Medicine

## 2021-06-23 ENCOUNTER — Other Ambulatory Visit: Payer: Self-pay

## 2021-06-23 ENCOUNTER — Encounter (HOSPITAL_COMMUNITY): Payer: Self-pay

## 2021-06-23 DIAGNOSIS — R42 Dizziness and giddiness: Secondary | ICD-10-CM | POA: Insufficient documentation

## 2021-06-23 DIAGNOSIS — J45909 Unspecified asthma, uncomplicated: Secondary | ICD-10-CM | POA: Insufficient documentation

## 2021-06-23 DIAGNOSIS — Z7982 Long term (current) use of aspirin: Secondary | ICD-10-CM | POA: Insufficient documentation

## 2021-06-23 DIAGNOSIS — I2510 Atherosclerotic heart disease of native coronary artery without angina pectoris: Secondary | ICD-10-CM | POA: Insufficient documentation

## 2021-06-23 DIAGNOSIS — Z79899 Other long term (current) drug therapy: Secondary | ICD-10-CM | POA: Insufficient documentation

## 2021-06-23 DIAGNOSIS — I110 Hypertensive heart disease with heart failure: Secondary | ICD-10-CM | POA: Insufficient documentation

## 2021-06-23 DIAGNOSIS — I5042 Chronic combined systolic (congestive) and diastolic (congestive) heart failure: Secondary | ICD-10-CM | POA: Insufficient documentation

## 2021-06-23 DIAGNOSIS — R079 Chest pain, unspecified: Secondary | ICD-10-CM | POA: Insufficient documentation

## 2021-06-23 DIAGNOSIS — Z955 Presence of coronary angioplasty implant and graft: Secondary | ICD-10-CM | POA: Insufficient documentation

## 2021-06-23 DIAGNOSIS — Z87891 Personal history of nicotine dependence: Secondary | ICD-10-CM | POA: Insufficient documentation

## 2021-06-23 LAB — COMPREHENSIVE METABOLIC PANEL
ALT: 31 U/L (ref 0–44)
AST: 33 U/L (ref 15–41)
Albumin: 5 g/dL (ref 3.5–5.0)
Alkaline Phosphatase: 76 U/L (ref 38–126)
Anion gap: 9 (ref 5–15)
BUN: 13 mg/dL (ref 6–20)
CO2: 26 mmol/L (ref 22–32)
Calcium: 9.9 mg/dL (ref 8.9–10.3)
Chloride: 102 mmol/L (ref 98–111)
Creatinine, Ser: 1.02 mg/dL (ref 0.61–1.24)
GFR, Estimated: >60 mL/min (ref >60)
Glucose, Bld: 102 mg/dL — ABNORMAL HIGH (ref 70–99)
Potassium: 3.6 mmol/L (ref 3.5–5.1)
Sodium: 137 mmol/L (ref 135–145)
Total Bilirubin: 0.9 mg/dL (ref 0.3–1.2)
Total Protein: 8.5 g/dL — ABNORMAL HIGH (ref 6.5–8.1)

## 2021-06-23 LAB — CBC WITH DIFFERENTIAL/PLATELET
Abs Immature Granulocytes: 0.02 10*3/uL (ref 0.00–0.07)
Basophils Absolute: 0.1 10*3/uL (ref 0.0–0.1)
Basophils Relative: 1 %
Eosinophils Absolute: 0.2 10*3/uL (ref 0.0–0.5)
Eosinophils Relative: 3 %
HCT: 41.4 % (ref 39.0–52.0)
Hemoglobin: 14.6 g/dL (ref 13.0–17.0)
Immature Granulocytes: 0 %
Lymphocytes Relative: 31 %
Lymphs Abs: 2.1 10*3/uL (ref 0.7–4.0)
MCH: 30.9 pg (ref 26.0–34.0)
MCHC: 35.3 g/dL (ref 30.0–36.0)
MCV: 87.5 fL (ref 80.0–100.0)
Monocytes Absolute: 0.8 10*3/uL (ref 0.1–1.0)
Monocytes Relative: 11 %
Neutro Abs: 3.7 10*3/uL (ref 1.7–7.7)
Neutrophils Relative %: 54 %
Platelets: 362 10*3/uL (ref 150–400)
RBC: 4.73 MIL/uL (ref 4.22–5.81)
RDW: 12.2 % (ref 11.5–15.5)
WBC: 6.8 10*3/uL (ref 4.0–10.5)
nRBC: 0 % (ref 0.0–0.2)

## 2021-06-23 LAB — TROPONIN I (HIGH SENSITIVITY): Troponin I (High Sensitivity): 7 ng/L (ref <18)

## 2021-06-23 NOTE — ED Triage Notes (Signed)
Pt complains of chest pain since earlier today. Pt reports Hx of MI.
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
## 2021-06-24 LAB — TROPONIN I (HIGH SENSITIVITY): Troponin I (High Sensitivity): 6 ng/L (ref <18)

## 2021-06-24 MED ORDER — KETOROLAC TROMETHAMINE 15 MG/ML IJ SOLN
15.0000 mg | Freq: Once | INTRAMUSCULAR | Status: AC
  Administered 2021-06-24: 15 mg via INTRAVENOUS
  Filled 2021-06-24: qty 1

## 2021-06-24 NOTE — Discharge Instructions (Addendum)
Your work-up in the emergency department today has been reassuring.  We recommend close follow-up in the office with your cardiologist.  Continue your daily prescribed medications.  You may return for new or concerning symptoms.

## 2021-07-09 ENCOUNTER — Other Ambulatory Visit (HOSPITAL_COMMUNITY): Payer: Self-pay

## 2021-07-14 ENCOUNTER — Encounter (HOSPITAL_COMMUNITY): Payer: Self-pay

## 2021-07-14 ENCOUNTER — Emergency Department (HOSPITAL_COMMUNITY)
Admission: EM | Admit: 2021-07-14 | Discharge: 2021-07-14 | Disposition: A | Discharge status: Home / Self Care | Payer: Self-pay | Attending: Emergency Medicine | Admitting: Emergency Medicine

## 2021-07-14 ENCOUNTER — Other Ambulatory Visit: Payer: Self-pay

## 2021-07-14 DIAGNOSIS — J45909 Unspecified asthma, uncomplicated: Secondary | ICD-10-CM | POA: Insufficient documentation

## 2021-07-14 DIAGNOSIS — R112 Nausea with vomiting, unspecified: Secondary | ICD-10-CM | POA: Insufficient documentation

## 2021-07-14 DIAGNOSIS — Z87891 Personal history of nicotine dependence: Secondary | ICD-10-CM | POA: Insufficient documentation

## 2021-07-14 DIAGNOSIS — I2510 Atherosclerotic heart disease of native coronary artery without angina pectoris: Secondary | ICD-10-CM | POA: Insufficient documentation

## 2021-07-14 DIAGNOSIS — Z8616 Personal history of COVID-19: Secondary | ICD-10-CM | POA: Insufficient documentation

## 2021-07-14 DIAGNOSIS — Z7902 Long term (current) use of antithrombotics/antiplatelets: Secondary | ICD-10-CM | POA: Insufficient documentation

## 2021-07-14 DIAGNOSIS — Z951 Presence of aortocoronary bypass graft: Secondary | ICD-10-CM | POA: Insufficient documentation

## 2021-07-14 DIAGNOSIS — Z20822 Contact with and (suspected) exposure to covid-19: Secondary | ICD-10-CM | POA: Insufficient documentation

## 2021-07-14 DIAGNOSIS — I5042 Chronic combined systolic (congestive) and diastolic (congestive) heart failure: Secondary | ICD-10-CM | POA: Insufficient documentation

## 2021-07-14 DIAGNOSIS — I110 Hypertensive heart disease with heart failure: Secondary | ICD-10-CM | POA: Insufficient documentation

## 2021-07-14 DIAGNOSIS — Z79899 Other long term (current) drug therapy: Secondary | ICD-10-CM | POA: Insufficient documentation

## 2021-07-14 DIAGNOSIS — R109 Unspecified abdominal pain: Secondary | ICD-10-CM | POA: Insufficient documentation

## 2021-07-14 DIAGNOSIS — R5383 Other fatigue: Secondary | ICD-10-CM | POA: Insufficient documentation

## 2021-07-14 DIAGNOSIS — R197 Diarrhea, unspecified: Secondary | ICD-10-CM | POA: Insufficient documentation

## 2021-07-14 LAB — CBC WITH DIFFERENTIAL/PLATELET
Abs Immature Granulocytes: 0.01 10*3/uL (ref 0.00–0.07)
Basophils Absolute: 0 10*3/uL (ref 0.0–0.1)
Basophils Relative: 1 %
Eosinophils Absolute: 0.2 10*3/uL (ref 0.0–0.5)
Eosinophils Relative: 3 %
HCT: 42.5 % (ref 39.0–52.0)
Hemoglobin: 14.5 g/dL (ref 13.0–17.0)
Immature Granulocytes: 0 %
Lymphocytes Relative: 31 %
Lymphs Abs: 1.7 10*3/uL (ref 0.7–4.0)
MCH: 30.5 pg (ref 26.0–34.0)
MCHC: 34.1 g/dL (ref 30.0–36.0)
MCV: 89.5 fL (ref 80.0–100.0)
Monocytes Absolute: 0.6 10*3/uL (ref 0.1–1.0)
Monocytes Relative: 10 %
Neutro Abs: 3.1 10*3/uL (ref 1.7–7.7)
Neutrophils Relative %: 55 %
Platelets: 327 10*3/uL (ref 150–400)
RBC: 4.75 MIL/uL (ref 4.22–5.81)
RDW: 12.3 % (ref 11.5–15.5)
WBC: 5.6 10*3/uL (ref 4.0–10.5)
nRBC: 0 % (ref 0.0–0.2)

## 2021-07-14 LAB — URINALYSIS, ROUTINE W REFLEX MICROSCOPIC
Bacteria, UA: NONE SEEN
Bilirubin Urine: NEGATIVE
Glucose, UA: NEGATIVE mg/dL
Hgb urine dipstick: NEGATIVE
Ketones, ur: NEGATIVE mg/dL
Leukocytes,Ua: NEGATIVE
Nitrite: NEGATIVE
Protein, ur: NEGATIVE mg/dL
Specific Gravity, Urine: 1.02 (ref 1.005–1.030)
pH: 6 (ref 5.0–8.0)

## 2021-07-14 LAB — COMPREHENSIVE METABOLIC PANEL
ALT: 30 U/L (ref 0–44)
AST: 26 U/L (ref 15–41)
Albumin: 4.6 g/dL (ref 3.5–5.0)
Alkaline Phosphatase: 67 U/L (ref 38–126)
Anion gap: 10 (ref 5–15)
BUN: 9 mg/dL (ref 6–20)
CO2: 27 mmol/L (ref 22–32)
Calcium: 9.4 mg/dL (ref 8.9–10.3)
Chloride: 101 mmol/L (ref 98–111)
Creatinine, Ser: 0.98 mg/dL (ref 0.61–1.24)
GFR, Estimated: >60 mL/min (ref >60)
Glucose, Bld: 105 mg/dL — ABNORMAL HIGH (ref 70–99)
Potassium: 3.8 mmol/L (ref 3.5–5.1)
Sodium: 138 mmol/L (ref 135–145)
Total Bilirubin: 1 mg/dL (ref 0.3–1.2)
Total Protein: 8 g/dL (ref 6.5–8.1)

## 2021-07-14 LAB — RESP PANEL BY RT-PCR (FLU A&B, COVID) ARPGX2
Influenza A by PCR: NEGATIVE
Influenza B by PCR: NEGATIVE
SARS Coronavirus 2 by RT PCR: NEGATIVE

## 2021-07-14 LAB — LIPASE, BLOOD: Lipase: 41 U/L (ref 11–51)

## 2021-07-14 MED ORDER — SODIUM CHLORIDE 0.9 % IV BOLUS
1000.0000 mL | Freq: Once | INTRAVENOUS | Status: AC
  Administered 2021-07-14: 1000 mL via INTRAVENOUS

## 2021-07-14 MED ORDER — ONDANSETRON 4 MG PO TBDP
4.0000 mg | ORAL_TABLET | Freq: Three times a day (TID) | ORAL | 0 refills | Status: DC | PRN
  Filled 2021-07-14: qty 8, 3d supply, fill #0

## 2021-07-14 MED ORDER — ONDANSETRON HCL 4 MG/2ML IJ SOLN
4.0000 mg | Freq: Once | INTRAMUSCULAR | Status: AC
  Administered 2021-07-14: 4 mg via INTRAVENOUS
  Filled 2021-07-14: qty 2

## 2021-07-14 NOTE — ED Notes (Signed)
Pt was able to tolerate PO challenge. 
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
## 2021-07-15 ENCOUNTER — Other Ambulatory Visit (HOSPITAL_COMMUNITY): Payer: Self-pay

## 2021-08-10 ENCOUNTER — Other Ambulatory Visit: Payer: Self-pay | Admitting: Family Medicine

## 2021-08-10 ENCOUNTER — Other Ambulatory Visit (HOSPITAL_COMMUNITY): Payer: Self-pay

## 2021-08-10 ENCOUNTER — Other Ambulatory Visit: Payer: Self-pay | Admitting: Physician Assistant

## 2021-08-10 MED ORDER — AMLODIPINE BESYLATE 10 MG PO TABS
10.0000 mg | ORAL_TABLET | Freq: Every day | ORAL | 0 refills | Status: DC
  Filled 2021-08-10: qty 90, 90d supply, fill #0

## 2021-08-10 MED ORDER — SPIRONOLACTONE 25 MG PO TABS
25.0000 mg | ORAL_TABLET | Freq: Every day | ORAL | 0 refills | Status: DC
  Filled 2021-08-10: qty 90, 90d supply, fill #0

## 2021-08-10 MED ORDER — CLOPIDOGREL BISULFATE 75 MG PO TABS
75.0000 mg | ORAL_TABLET | Freq: Every day | ORAL | 0 refills | Status: DC
  Filled 2021-08-10: qty 90, 90d supply, fill #0

## 2021-08-11 ENCOUNTER — Other Ambulatory Visit (HOSPITAL_COMMUNITY): Payer: Self-pay

## 2021-08-11 NOTE — Telephone Encounter (Signed)
Requested medications are due for refill today yes  Requested medications are on the active medication list there are two rx for nitroglycerin   Last refill 03/26/21  Last visit 03/11/20, asked to return in one month, NO SHOW 04/24/20 and 05/22/20, has not returned  Future visit scheduled No upcoming appt  Notes to clinic two rx for this med, failed protocol of visit within 12 months, please assess.

## 2021-08-12 ENCOUNTER — Other Ambulatory Visit (HOSPITAL_COMMUNITY): Payer: Self-pay

## 2021-08-12 ENCOUNTER — Ambulatory Visit (INDEPENDENT_AMBULATORY_CARE_PROVIDER_SITE_OTHER): Payer: Self-pay | Admitting: Physician Assistant

## 2021-08-12 ENCOUNTER — Encounter: Payer: Self-pay | Admitting: Physician Assistant

## 2021-08-12 ENCOUNTER — Other Ambulatory Visit: Payer: Self-pay

## 2021-08-12 VITALS — BP 138/80 | HR 71 | Ht 66.0 in | Wt 215.4 lb

## 2021-08-12 DIAGNOSIS — E7800 Pure hypercholesterolemia, unspecified: Secondary | ICD-10-CM

## 2021-08-12 DIAGNOSIS — I10 Essential (primary) hypertension: Secondary | ICD-10-CM

## 2021-08-12 DIAGNOSIS — I25119 Atherosclerotic heart disease of native coronary artery with unspecified angina pectoris: Secondary | ICD-10-CM

## 2021-08-12 DIAGNOSIS — I5020 Unspecified systolic (congestive) heart failure: Secondary | ICD-10-CM

## 2021-08-12 DIAGNOSIS — Z720 Tobacco use: Secondary | ICD-10-CM

## 2021-08-12 MED ORDER — NITROGLYCERIN 0.4 MG SL SUBL
SUBLINGUAL_TABLET | SUBLINGUAL | 2 refills | Status: DC | PRN
  Filled 2021-08-12: qty 25, 15d supply, fill #0
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
## 2021-08-12 NOTE — Patient Instructions (Signed)
Medication Instructions:   INCREASE Coreg one half tablet ( 12.5 mg ) twice daily.  *If you need a refill on your cardiac medications before your next appointment, please call your pharmacy*   Lab Work:  Your physician recommends that you return for a FASTING lipid profile on Thursday, November 3 between 7:30 -4:30 fasting from midnight the night before.   If you have labs (blood work) drawn today and your tests are completely normal, you will receive your results only by: MyChart Message (if you have MyChart) OR A paper copy in the mail If you have any lab test that is abnormal or we need to change your treatment, we will call you to review the results.   Testing/Procedures:  -NONE  Follow-Up: At [facility], you and your health needs are our priority.  As part of our continuing mission to provide you with exceptional heart care, we have created designated Provider Care Teams.  These Care Teams include your primary Cardiologist (physician) and Advanced Practice Providers (APPs -  Physician Assistants and Nurse Practitioners) who all work together to provide you with the care you need, when you need it.  We recommend signing up for the patient portal called "MyChart".  Sign up information is provided on this After Visit Summary.  MyChart is used to connect with patients for Virtual Visits (Telemedicine).  Patients are able to view lab/test results, encounter notes, upcoming appointments, etc.  Non-urgent messages can be sent to your provider as well.   To learn more about what you can do with MyChart, go to [email or web address].    Your next appointment:   6 month(s)  The format for your next appointment:   In Person  Provider:   Laurance Flatten, MD   Other Instructions Your physician wants you to follow-up in: 6 month with Dr. Shari Prows. You will receive a reminder letter in the mail two months in advance. If you don't receive a letter, please call our office to  schedule the follow-up appointment.  Please call or mychart if chest pain continues.

## 2021-08-14 NOTE — Addendum Note (Signed)
Addended by: Kerrie Buffalo on: 08/14/2021 03:50 PM   Modules accepted: Orders

## 2021-08-19 ENCOUNTER — Emergency Department (HOSPITAL_COMMUNITY)
Admission: EM | Admit: 2021-08-19 | Discharge: 2021-08-19 | Disposition: A | Discharge status: Home / Self Care | Payer: Self-pay | Attending: Emergency Medicine | Admitting: Emergency Medicine

## 2021-08-19 ENCOUNTER — Other Ambulatory Visit: Payer: Self-pay

## 2021-08-19 ENCOUNTER — Other Ambulatory Visit (HOSPITAL_COMMUNITY): Payer: Self-pay

## 2021-08-19 ENCOUNTER — Encounter (HOSPITAL_COMMUNITY): Payer: Self-pay | Admitting: Oncology

## 2021-08-19 DIAGNOSIS — K047 Periapical abscess without sinus: Secondary | ICD-10-CM

## 2021-08-19 DIAGNOSIS — Z79899 Other long term (current) drug therapy: Secondary | ICD-10-CM | POA: Insufficient documentation

## 2021-08-19 DIAGNOSIS — I110 Hypertensive heart disease with heart failure: Secondary | ICD-10-CM | POA: Insufficient documentation

## 2021-08-19 DIAGNOSIS — J45909 Unspecified asthma, uncomplicated: Secondary | ICD-10-CM | POA: Insufficient documentation

## 2021-08-19 DIAGNOSIS — Z87891 Personal history of nicotine dependence: Secondary | ICD-10-CM | POA: Insufficient documentation

## 2021-08-19 DIAGNOSIS — K029 Dental caries, unspecified: Secondary | ICD-10-CM | POA: Insufficient documentation

## 2021-08-19 DIAGNOSIS — Z7902 Long term (current) use of antithrombotics/antiplatelets: Secondary | ICD-10-CM | POA: Insufficient documentation

## 2021-08-19 DIAGNOSIS — I5042 Chronic combined systolic (congestive) and diastolic (congestive) heart failure: Secondary | ICD-10-CM | POA: Insufficient documentation

## 2021-08-19 DIAGNOSIS — I2510 Atherosclerotic heart disease of native coronary artery without angina pectoris: Secondary | ICD-10-CM | POA: Insufficient documentation

## 2021-08-19 MED ORDER — HYDROCODONE-ACETAMINOPHEN 5-325 MG PO TABS
2.0000 | ORAL_TABLET | ORAL | 0 refills | Status: AC | PRN
  Filled 2021-08-19: qty 10, 3d supply, fill #0

## 2021-08-19 MED ORDER — AMOXICILLIN-POT CLAVULANATE 875-125 MG PO TABS
1.0000 | ORAL_TABLET | Freq: Two times a day (BID) | ORAL | 0 refills | Status: DC
  Filled 2021-08-19: qty 14, 7d supply, fill #0

## 2021-08-19 NOTE — ED Triage Notes (Signed)
Pt reports left lower dental pain starting Monday.

## 2021-08-19 NOTE — Discharge Instructions (Addendum)
You were seen in the emergency department today for dental pain.  When I examine your throat it looks like you have a dental abscess in the left lower side.  I would like you to start you on a course of antibiotics to prevent worsening infection.  This medication is called Augmentin and you will take it twice daily for the next 7 days.  I am also prescribing you a short course of pain medication called Norco.  This is a narcotic medication so do not combine with alcohol.  It also has a Tylenol component, so do not take additional Tylenol while taking this medication.  I have attached a resource guide for dentists in the area.  I would like you to follow-up with a dentist as soon as possible.  Continue to monitor how you're doing and return to the ER for new or worsening symptoms such as inability to swallow, inability to open your mouth, or difficulty breathing..   It has been a pleasure seeing and caring for you today and I hope you start feeling better soon!
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
## 2021-08-19 NOTE — ED Notes (Signed)
An After Visit Summary was printed and given to the patient. Discharge instructions given and no further questions at this time.  

## 2021-08-21 ENCOUNTER — Other Ambulatory Visit (HOSPITAL_COMMUNITY): Payer: Self-pay

## 2021-09-03 ENCOUNTER — Other Ambulatory Visit: Payer: Self-pay

## 2021-09-15 ENCOUNTER — Other Ambulatory Visit (HOSPITAL_COMMUNITY): Payer: Self-pay

## 2021-09-15 ENCOUNTER — Other Ambulatory Visit: Payer: Self-pay | Admitting: Physician Assistant

## 2021-09-15 ENCOUNTER — Other Ambulatory Visit: Payer: Self-pay | Admitting: Cardiology

## 2021-09-15 MED ORDER — LOSARTAN POTASSIUM 100 MG PO TABS
100.0000 mg | ORAL_TABLET | Freq: Every day | ORAL | 3 refills | Status: DC
  Filled 2021-09-15 – 2021-09-16 (×2): qty 30, 30d supply, fill #0
  Filled 2021-10-20: qty 30, 30d supply, fill #1

## 2021-09-15 MED ORDER — ISOSORBIDE MONONITRATE ER 30 MG PO TB24
30.0000 mg | ORAL_TABLET | Freq: Every day | ORAL | 3 refills | Status: DC
  Filled 2021-09-15 – 2021-09-16 (×2): qty 30, 30d supply, fill #0
  Filled 2021-10-20: qty 30, 30d supply, fill #1

## 2021-09-16 ENCOUNTER — Other Ambulatory Visit (HOSPITAL_COMMUNITY): Payer: Self-pay

## 2021-10-20 ENCOUNTER — Other Ambulatory Visit (HOSPITAL_COMMUNITY): Payer: Self-pay

## 2021-10-28 ENCOUNTER — Other Ambulatory Visit (HOSPITAL_COMMUNITY): Payer: Self-pay

## 2022-02-26 ENCOUNTER — Encounter: Payer: Self-pay | Admitting: Internal Medicine

## 2022-03-31 ENCOUNTER — Encounter: Payer: Self-pay | Admitting: Family Medicine

## 2022-04-02 ENCOUNTER — Other Ambulatory Visit: Payer: Self-pay | Admitting: Family Medicine

## 2022-04-02 ENCOUNTER — Encounter: Payer: Self-pay | Admitting: Family Medicine

## 2022-04-02 ENCOUNTER — Ambulatory Visit: Payer: Self-pay | Attending: Internal Medicine | Admitting: Family Medicine

## 2022-04-02 ENCOUNTER — Other Ambulatory Visit: Payer: Self-pay

## 2022-04-02 VITALS — BP 153/107 | HR 80 | Ht 67.5 in | Wt 198.4 lb

## 2022-04-02 DIAGNOSIS — I2121 ST elevation (STEMI) myocardial infarction involving left circumflex coronary artery: Secondary | ICD-10-CM

## 2022-04-02 DIAGNOSIS — Z1159 Encounter for screening for other viral diseases: Secondary | ICD-10-CM

## 2022-04-02 DIAGNOSIS — I10 Essential (primary) hypertension: Secondary | ICD-10-CM

## 2022-04-02 DIAGNOSIS — I25118 Atherosclerotic heart disease of native coronary artery with other forms of angina pectoris: Secondary | ICD-10-CM

## 2022-04-02 MED ORDER — AMLODIPINE BESYLATE 10 MG PO TABS
10.0000 mg | ORAL_TABLET | Freq: Every day | ORAL | 1 refills | Status: AC
Start: 2022-04-02 — End: ?
  Filled 2022-04-02: qty 30, 30d supply, fill #0
  Filled 2022-07-01 (×2): qty 30, 30d supply, fill #1
  Filled 2022-09-27: qty 30, 30d supply, fill #2

## 2022-04-02 MED ORDER — LOSARTAN POTASSIUM 100 MG PO TABS
100.0000 mg | ORAL_TABLET | Freq: Every day | ORAL | 1 refills | Status: DC
  Filled 2022-04-02: qty 30, 30d supply, fill #0

## 2022-04-02 MED ORDER — CARVEDILOL 12.5 MG PO TABS
12.5000 mg | ORAL_TABLET | Freq: Every day | ORAL | 1 refills | Status: DC
  Filled 2022-04-02: qty 30, 30d supply, fill #0

## 2022-04-02 MED ORDER — CARVEDILOL 12.5 MG PO TABS
12.5000 mg | ORAL_TABLET | Freq: Two times a day (BID) | ORAL | 1 refills | Status: AC
  Filled 2022-04-02: qty 180, 90d supply, fill #0
  Filled 2022-07-01: qty 180, 90d supply, fill #1
  Filled 2022-07-01: qty 60, 30d supply, fill #1
  Filled 2022-09-27: qty 60, 30d supply, fill #2

## 2022-04-02 MED ORDER — SPIRONOLACTONE 25 MG PO TABS
25.0000 mg | ORAL_TABLET | Freq: Every day | ORAL | 1 refills | Status: AC
Start: 2022-04-02 — End: ?
  Filled 2022-04-02: qty 30, 30d supply, fill #0
  Filled 2022-07-01 (×2): qty 30, 30d supply, fill #1
  Filled 2022-09-27: qty 30, 30d supply, fill #2

## 2022-04-02 MED ORDER — CLOPIDOGREL BISULFATE 75 MG PO TABS
75.0000 mg | ORAL_TABLET | Freq: Every day | ORAL | 1 refills | Status: AC
Start: 2022-04-02 — End: ?
  Filled 2022-04-02: qty 30, 30d supply, fill #0
  Filled 2022-07-01 (×2): qty 30, 30d supply, fill #1
  Filled 2022-09-27: qty 30, 30d supply, fill #2

## 2022-04-02 MED ORDER — ISOSORBIDE MONONITRATE ER 30 MG PO TB24
30.0000 mg | ORAL_TABLET | Freq: Every day | ORAL | 1 refills | Status: AC
  Filled 2022-04-02: qty 30, 30d supply, fill #0
  Filled 2022-07-01 (×2): qty 30, 30d supply, fill #1
  Filled 2022-09-27: qty 30, 30d supply, fill #2

## 2022-04-02 MED ORDER — EZETIMIBE 10 MG PO TABS
10.0000 mg | ORAL_TABLET | Freq: Every day | ORAL | 1 refills | Status: AC
Start: 2022-04-02 — End: ?
  Filled 2022-04-02: qty 30, 30d supply, fill #0
  Filled 2022-07-01 (×2): qty 30, 30d supply, fill #1
  Filled 2022-09-27: qty 30, 30d supply, fill #2

## 2022-04-02 MED ORDER — NITROGLYCERIN 0.4 MG SL SUBL
SUBLINGUAL_TABLET | SUBLINGUAL | 2 refills | Status: DC | PRN
  Filled 2022-04-02: qty 25, 25d supply, fill #0

## 2022-04-02 MED ORDER — ATORVASTATIN CALCIUM 40 MG PO TABS
40.0000 mg | ORAL_TABLET | Freq: Every day | ORAL | 1 refills | Status: AC
  Filled 2022-04-02: qty 30, 30d supply, fill #0
  Filled 2022-07-01 (×2): qty 30, 30d supply, fill #1
  Filled 2022-09-27: qty 30, 30d supply, fill #2

## 2022-04-02 NOTE — Progress Notes (Signed)
No BP medications in months.
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
## 2022-04-02 NOTE — Patient Instructions (Signed)
Low-Sodium Eating Plan Sodium, which is an element that makes up salt, helps you maintain a healthy balance of fluids in your body. Too much sodium can increase your blood pressure and cause fluid and waste to be held in your body. Your health care provider or dietitian may recommend following this plan if you have high blood pressure (hypertension), kidney disease, liver disease, or heart failure. Eating less sodium can help lower your blood pressure, reduce swelling, and protect your heart, liver, and kidneys. What are tips for following this plan? Reading food labels The Nutrition Facts label lists the amount of sodium in one serving of the food. If you eat more than one serving, you must multiply the listed amount of sodium by the number of servings. Choose foods with less than 140 mg of sodium per serving. Avoid foods with 300 mg of sodium or more per serving. Shopping  Look for lower-sodium products, often labeled as "low-sodium" or "no salt added." Always check the sodium content, even if foods are labeled as "unsalted" or "no salt added." Buy fresh foods. Avoid canned foods and pre-made or frozen meals. Avoid canned, cured, or processed meats. Buy breads that have less than 80 mg of sodium per slice. Cooking  Eat more home-cooked food and less restaurant, buffet, and fast food. Avoid adding salt when cooking. Use salt-free seasonings or herbs instead of table salt or sea salt. Check with your health care provider or pharmacist before using salt substitutes. Cook with plant-based oils, such as canola, sunflower, or olive oil. Meal planning When eating at a restaurant, ask that your food be prepared with less salt or no salt, if possible. Avoid dishes labeled as brined, pickled, cured, smoked, or made with soy sauce, miso, or teriyaki sauce. Avoid foods that contain MSG (monosodium glutamate). MSG is sometimes added to Chinese food, bouillon, and some canned foods. Make meals that can  be grilled, baked, poached, roasted, or steamed. These are generally made with less sodium. General information Most people on this plan should limit their sodium intake to 1,500-2,000 mg (milligrams) of sodium each day. What foods should I eat? Fruits Fresh, frozen, or canned fruit. Fruit juice. Vegetables Fresh or frozen vegetables. "No salt added" canned vegetables. "No salt added" tomato sauce and paste. Low-sodium or reduced-sodium tomato and vegetable juice. Grains Low-sodium cereals, including oats, puffed wheat and rice, and shredded wheat. Low-sodium crackers. Unsalted rice. Unsalted pasta. Low-sodium bread. Whole-grain breads and whole-grain pasta. Meats and other proteins Fresh or frozen (no salt added) meat, poultry, seafood, and fish. Low-sodium canned tuna and salmon. Unsalted nuts. Dried peas, beans, and lentils without added salt. Unsalted canned beans. Eggs. Unsalted nut butters. Dairy Milk. Soy milk. Cheese that is naturally low in sodium, such as ricotta cheese, fresh mozzarella, or Swiss cheese. Low-sodium or reduced-sodium cheese. Cream cheese. Yogurt. Seasonings and condiments Fresh and dried herbs and spices. Salt-free seasonings. Low-sodium mustard and ketchup. Sodium-free salad dressing. Sodium-free light mayonnaise. Fresh or refrigerated horseradish. Lemon juice. Vinegar. Other foods Homemade, reduced-sodium, or low-sodium soups. Unsalted popcorn and pretzels. Low-salt or salt-free chips. The items listed above may not be a complete list of foods and beverages you can eat. Contact a dietitian for more information. What foods should I avoid? Vegetables Sauerkraut, pickled vegetables, and relishes. Olives. French fries. Onion rings. Regular canned vegetables (not low-sodium or reduced-sodium). Regular canned tomato sauce and paste (not low-sodium or reduced-sodium). Regular tomato and vegetable juice (not low-sodium or reduced-sodium). Frozen vegetables in  sauces. Grains   Instant hot cereals. Bread stuffing, pancake, and biscuit mixes. Croutons. Seasoned rice or pasta mixes. Noodle soup cups. Boxed or frozen macaroni and cheese. Regular salted crackers. Self-rising flour. Meats and other proteins Meat or fish that is salted, canned, smoked, spiced, or pickled. Precooked or cured meat, such as sausages or meat loaves. Bacon. Ham. Pepperoni. Hot dogs. Corned beef. Chipped beef. Salt pork. Jerky. Pickled herring. Anchovies and sardines. Regular canned tuna. Salted nuts. Dairy Processed cheese and cheese spreads. Hard cheeses. Cheese curds. Blue cheese. Feta cheese. String cheese. Regular cottage cheese. Buttermilk. Canned milk. Fats and oils Salted butter. Regular margarine. Ghee. Bacon fat. Seasonings and condiments Onion salt, garlic salt, seasoned salt, table salt, and sea salt. Canned and packaged gravies. Worcestershire sauce. Tartar sauce. Barbecue sauce. Teriyaki sauce. Soy sauce, including reduced-sodium. Steak sauce. Fish sauce. Oyster sauce. Cocktail sauce. Horseradish that you find on the shelf. Regular ketchup and mustard. Meat flavorings and tenderizers. Bouillon cubes. Hot sauce. Pre-made or packaged marinades. Pre-made or packaged taco seasonings. Relishes. Regular salad dressings. Salsa. Other foods Salted popcorn and pretzels. Corn chips and puffs. Potato and tortilla chips. Canned or dried soups. Pizza. Frozen entrees and pot pies. The items listed above may not be a complete list of foods and beverages you should avoid. Contact a dietitian for more information. Summary Eating less sodium can help lower your blood pressure, reduce swelling, and protect your heart, liver, and kidneys. Most people on this plan should limit their sodium intake to 1,500-2,000 mg (milligrams) of sodium each day. Canned, boxed, and frozen foods are high in sodium. Restaurant foods, fast foods, and pizza are also very high in sodium. You also get sodium by  adding salt to food. Try to cook at home, eat more fresh fruits and vegetables, and eat less fast food and canned, processed, or prepared foods. This information is not intended to replace advice given to you by your health care provider. Make sure you discuss any questions you have with your health care provider. Document Revised: 11/23/2019 Document Reviewed: 09/19/2019 Elsevier Patient Education  2023 Elsevier Inc.  

## 2022-04-03 ENCOUNTER — Other Ambulatory Visit: Payer: Self-pay | Admitting: Family Medicine

## 2022-04-03 DIAGNOSIS — R7989 Other specified abnormal findings of blood chemistry: Secondary | ICD-10-CM

## 2022-04-03 LAB — CMP14+EGFR
ALT: 52 IU/L — ABNORMAL HIGH (ref 0–44)
AST: 54 IU/L — ABNORMAL HIGH (ref 0–40)
Albumin/Globulin Ratio: 1.5 (ref 1.2–2.2)
Albumin: 4.7 g/dL (ref 4.0–5.0)
Alkaline Phosphatase: 130 IU/L — ABNORMAL HIGH (ref 44–121)
BUN/Creatinine Ratio: 5 — ABNORMAL LOW (ref 9–20)
BUN: 5 mg/dL — ABNORMAL LOW (ref 6–20)
Bilirubin Total: 0.3 mg/dL (ref 0.0–1.2)
CO2: 23 mmol/L (ref 20–29)
Calcium: 9.6 mg/dL (ref 8.7–10.2)
Chloride: 101 mmol/L (ref 96–106)
Creatinine, Ser: 0.98 mg/dL (ref 0.76–1.27)
Globulin, Total: 3.2 g/dL (ref 1.5–4.5)
Glucose: 97 mg/dL (ref 70–99)
Potassium: 3.9 mmol/L (ref 3.5–5.2)
Sodium: 141 mmol/L (ref 134–144)
Total Protein: 7.9 g/dL (ref 6.0–8.5)
eGFR: 106 mL/min/{1.73_m2} (ref >59)

## 2022-04-03 LAB — CBC WITH DIFFERENTIAL/PLATELET
Basophils Absolute: 0.1 10*3/uL (ref 0.0–0.2)
Basos: 1 %
EOS (ABSOLUTE): 0.2 10*3/uL (ref 0.0–0.4)
Eos: 2 %
Hematocrit: 45.5 % (ref 37.5–51.0)
Hemoglobin: 15.6 g/dL (ref 13.0–17.7)
Immature Grans (Abs): 0 10*3/uL (ref 0.0–0.1)
Immature Granulocytes: 0 %
Lymphocytes Absolute: 2.4 10*3/uL (ref 0.7–3.1)
Lymphs: 29 %
MCH: 31.3 pg (ref 26.6–33.0)
MCHC: 34.3 g/dL (ref 31.5–35.7)
MCV: 91 fL (ref 79–97)
Monocytes Absolute: 0.6 10*3/uL (ref 0.1–0.9)
Monocytes: 7 %
Neutrophils Absolute: 5 10*3/uL (ref 1.4–7.0)
Neutrophils: 61 %
Platelets: 313 10*3/uL (ref 150–450)
RBC: 4.99 x10E6/uL (ref 4.14–5.80)
RDW: 13.3 % (ref 11.6–15.4)
WBC: 8.3 10*3/uL (ref 3.4–10.8)

## 2022-04-03 LAB — HCV INTERPRETATION

## 2022-04-03 LAB — LP+NON-HDL CHOLESTEROL
Cholesterol, Total: 173 mg/dL (ref 100–199)
HDL: 94 mg/dL (ref >39)
LDL Chol Calc (NIH): 63 mg/dL (ref 0–99)
Total Non-HDL-Chol (LDL+VLDL): 79 mg/dL (ref 0–129)
Triglycerides: 91 mg/dL (ref 0–149)
VLDL Cholesterol Cal: 16 mg/dL (ref 5–40)

## 2022-04-03 LAB — HCV AB W REFLEX TO QUANT PCR: HCV Ab: NONREACTIVE

## 2022-04-07 ENCOUNTER — Other Ambulatory Visit: Payer: Self-pay

## 2022-04-16 ENCOUNTER — Other Ambulatory Visit: Payer: Self-pay

## 2022-04-16 DIAGNOSIS — R7989 Other specified abnormal findings of blood chemistry: Secondary | ICD-10-CM

## 2022-04-16 DIAGNOSIS — I25119 Atherosclerotic heart disease of native coronary artery with unspecified angina pectoris: Secondary | ICD-10-CM

## 2022-04-16 DIAGNOSIS — I10 Essential (primary) hypertension: Secondary | ICD-10-CM

## 2022-04-16 DIAGNOSIS — E7800 Pure hypercholesterolemia, unspecified: Secondary | ICD-10-CM

## 2022-04-19 ENCOUNTER — Other Ambulatory Visit: Payer: Self-pay

## 2022-05-10 ENCOUNTER — Ambulatory Visit: Payer: Self-pay | Admitting: Pharmacist

## 2022-05-11 ENCOUNTER — Inpatient Hospital Stay (HOSPITAL_COMMUNITY)
Admission: EM | Admit: 2022-05-11 | Discharge: 2022-05-14 | DRG: 683 | Disposition: A | Discharge status: Home / Self Care | Payer: Self-pay | Attending: Internal Medicine | Admitting: Internal Medicine

## 2022-05-11 ENCOUNTER — Emergency Department (HOSPITAL_COMMUNITY): Payer: Self-pay

## 2022-05-11 ENCOUNTER — Observation Stay (HOSPITAL_COMMUNITY): Payer: Self-pay

## 2022-05-11 ENCOUNTER — Encounter (HOSPITAL_COMMUNITY): Payer: Self-pay

## 2022-05-11 ENCOUNTER — Other Ambulatory Visit: Payer: Self-pay

## 2022-05-11 DIAGNOSIS — Z87891 Personal history of nicotine dependence: Secondary | ICD-10-CM

## 2022-05-11 DIAGNOSIS — E785 Hyperlipidemia, unspecified: Secondary | ICD-10-CM | POA: Diagnosis present

## 2022-05-11 DIAGNOSIS — R197 Diarrhea, unspecified: Secondary | ICD-10-CM | POA: Diagnosis present

## 2022-05-11 DIAGNOSIS — I5042 Chronic combined systolic (congestive) and diastolic (congestive) heart failure: Secondary | ICD-10-CM | POA: Diagnosis present

## 2022-05-11 DIAGNOSIS — Z79899 Other long term (current) drug therapy: Secondary | ICD-10-CM

## 2022-05-11 DIAGNOSIS — Z1889 Other specified retained foreign body fragments: Secondary | ICD-10-CM

## 2022-05-11 DIAGNOSIS — I110 Hypertensive heart disease with heart failure: Secondary | ICD-10-CM | POA: Diagnosis present

## 2022-05-11 DIAGNOSIS — I513 Intracardiac thrombosis, not elsewhere classified: Secondary | ICD-10-CM | POA: Diagnosis present

## 2022-05-11 DIAGNOSIS — I255 Ischemic cardiomyopathy: Secondary | ICD-10-CM | POA: Diagnosis present

## 2022-05-11 DIAGNOSIS — I252 Old myocardial infarction: Secondary | ICD-10-CM

## 2022-05-11 DIAGNOSIS — R7989 Other specified abnormal findings of blood chemistry: Secondary | ICD-10-CM | POA: Diagnosis present

## 2022-05-11 DIAGNOSIS — Z8249 Family history of ischemic heart disease and other diseases of the circulatory system: Secondary | ICD-10-CM

## 2022-05-11 DIAGNOSIS — Z7902 Long term (current) use of antithrombotics/antiplatelets: Secondary | ICD-10-CM

## 2022-05-11 DIAGNOSIS — Z833 Family history of diabetes mellitus: Secondary | ICD-10-CM

## 2022-05-11 DIAGNOSIS — I2582 Chronic total occlusion of coronary artery: Secondary | ICD-10-CM | POA: Diagnosis present

## 2022-05-11 DIAGNOSIS — N179 Acute kidney failure, unspecified: Principal | ICD-10-CM | POA: Diagnosis present

## 2022-05-11 DIAGNOSIS — I240 Acute coronary thrombosis not resulting in myocardial infarction: Secondary | ICD-10-CM | POA: Diagnosis present

## 2022-05-11 DIAGNOSIS — I10 Essential (primary) hypertension: Secondary | ICD-10-CM | POA: Diagnosis present

## 2022-05-11 DIAGNOSIS — I2510 Atherosclerotic heart disease of native coronary artery without angina pectoris: Secondary | ICD-10-CM | POA: Diagnosis present

## 2022-05-11 DIAGNOSIS — J45909 Unspecified asthma, uncomplicated: Secondary | ICD-10-CM | POA: Diagnosis present

## 2022-05-11 DIAGNOSIS — Y249XXS Unspecified firearm discharge, undetermined intent, sequela: Secondary | ICD-10-CM

## 2022-05-11 DIAGNOSIS — R079 Chest pain, unspecified: Secondary | ICD-10-CM | POA: Diagnosis present

## 2022-05-11 LAB — CBC
HCT: 45.1 % (ref 39.0–52.0)
Hemoglobin: 15.1 g/dL (ref 13.0–17.0)
MCH: 31.4 pg (ref 26.0–34.0)
MCHC: 33.5 g/dL (ref 30.0–36.0)
MCV: 93.8 fL (ref 80.0–100.0)
Platelets: 307 10*3/uL (ref 150–400)
RBC: 4.81 MIL/uL (ref 4.22–5.81)
RDW: 12.3 % (ref 11.5–15.5)
WBC: 10.5 10*3/uL (ref 4.0–10.5)
nRBC: 0 % (ref 0.0–0.2)

## 2022-05-11 LAB — BASIC METABOLIC PANEL
Anion gap: 11 (ref 5–15)
BUN: 13 mg/dL (ref 6–20)
CO2: 19 mmol/L — ABNORMAL LOW (ref 22–32)
Calcium: 8.5 mg/dL — ABNORMAL LOW (ref 8.9–10.3)
Chloride: 107 mmol/L (ref 98–111)
Creatinine, Ser: 1.79 mg/dL — ABNORMAL HIGH (ref 0.61–1.24)
GFR, Estimated: 51 mL/min — ABNORMAL LOW (ref >60)
Glucose, Bld: 84 mg/dL (ref 70–99)
Potassium: 3.2 mmol/L — ABNORMAL LOW (ref 3.5–5.1)
Sodium: 137 mmol/L (ref 135–145)

## 2022-05-11 LAB — URINALYSIS, ROUTINE W REFLEX MICROSCOPIC
Bilirubin Urine: NEGATIVE
Glucose, UA: NEGATIVE mg/dL
Hgb urine dipstick: NEGATIVE
Ketones, ur: NEGATIVE mg/dL
Leukocytes,Ua: NEGATIVE
Nitrite: NEGATIVE
Protein, ur: NEGATIVE mg/dL
Specific Gravity, Urine: 1.005 (ref 1.005–1.030)
pH: 5 (ref 5.0–8.0)

## 2022-05-11 LAB — HEPATIC FUNCTION PANEL
ALT: 22 U/L (ref 0–44)
AST: 22 U/L (ref 15–41)
Albumin: 3.2 g/dL — ABNORMAL LOW (ref 3.5–5.0)
Alkaline Phosphatase: 59 U/L (ref 38–126)
Bilirubin, Direct: 0.1 mg/dL (ref 0.0–0.2)
Indirect Bilirubin: 0.5 mg/dL (ref 0.3–0.9)
Total Bilirubin: 0.6 mg/dL (ref 0.3–1.2)
Total Protein: 5.2 g/dL — ABNORMAL LOW (ref 6.5–8.1)

## 2022-05-11 LAB — CK: Total CK: 215 U/L (ref 49–397)

## 2022-05-11 LAB — D-DIMER, QUANTITATIVE: D-Dimer, Quant: <0.27 ug/mL-FEU (ref 0.00–0.50)

## 2022-05-11 LAB — TROPONIN I (HIGH SENSITIVITY)
Troponin I (High Sensitivity): 6 ng/L (ref <18)
Troponin I (High Sensitivity): 6 ng/L (ref <18)

## 2022-05-11 MED ORDER — ONDANSETRON HCL 4 MG/2ML IJ SOLN: 4.0000 mg | Freq: Four times a day (QID) | INTRAMUSCULAR | Status: DC | PRN

## 2022-05-11 MED ORDER — LACTATED RINGERS IV SOLN: INTRAVENOUS | Status: DC

## 2022-05-11 MED ORDER — POTASSIUM CHLORIDE CRYS ER 20 MEQ PO TBCR
40.0000 meq | EXTENDED_RELEASE_TABLET | Freq: Once | ORAL | Status: AC
  Administered 2022-05-12: 40 meq via ORAL
  Filled 2022-05-11: qty 2

## 2022-05-11 MED ORDER — LACTATED RINGERS IV BOLUS
1000.0000 mL | Freq: Once | INTRAVENOUS | Status: AC
  Administered 2022-05-11: 1000 mL via INTRAVENOUS

## 2022-05-11 MED ORDER — ACETAMINOPHEN 325 MG PO TABS
650.0000 mg | ORAL_TABLET | ORAL | Status: DC | PRN
  Administered 2022-05-13: 650 mg via ORAL
  Filled 2022-05-11: qty 2

## 2022-05-11 MED ORDER — ASPIRIN 81 MG PO CHEW
324.0000 mg | CHEWABLE_TABLET | Freq: Once | ORAL | Status: AC
  Administered 2022-05-11: 324 mg via ORAL
  Filled 2022-05-11: qty 4

## 2022-05-11 MED ORDER — ENOXAPARIN SODIUM 40 MG/0.4ML IJ SOSY
40.0000 mg | PREFILLED_SYRINGE | INTRAMUSCULAR | Status: DC
  Administered 2022-05-12 – 2022-05-14 (×2): 40 mg via SUBCUTANEOUS
  Filled 2022-05-11 (×4): qty 0.4
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
## 2022-05-11 NOTE — Assessment & Plan Note (Signed)
Hold aldactone and ARB Continue other BP meds
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
## 2022-05-11 NOTE — ED Notes (Signed)
Pt provided with a bag lunch as well as apple juice.

## 2022-05-11 NOTE — Assessment & Plan Note (Signed)
Unexplained AKI is the primary reason for admission at the moment. Possibly pre-renal from recent diarrhea? 1. IVF: LR at 125 cc/hr, got 1L bolus in ED 1. Replace K 2. Strict intake and output 3. Repeat BMP in AM 4. Hold aldactone and ARB 5. Renal US
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
## 2022-05-11 NOTE — Assessment & Plan Note (Addendum)
See discussion regarding prior coronary events below (bit of an unusual history). 1. CP obs pathway 2. Continue to trend trops 3. Tele monitor 4. May want to discuss with cards tomorrow, but unless trop elevates, I have a feeling they may want to not re-cath him at this time (especially with AKI present today). 1. Will therefore let pt eat 5. Continue chronic CAD meds
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
## 2022-05-12 ENCOUNTER — Observation Stay (HOSPITAL_COMMUNITY): Payer: Self-pay

## 2022-05-12 ENCOUNTER — Encounter (HOSPITAL_COMMUNITY): Payer: Self-pay | Admitting: Internal Medicine

## 2022-05-12 DIAGNOSIS — R079 Chest pain, unspecified: Secondary | ICD-10-CM

## 2022-05-12 LAB — BASIC METABOLIC PANEL
Anion gap: 5 (ref 5–15)
BUN: 12 mg/dL (ref 6–20)
CO2: 27 mmol/L (ref 22–32)
Calcium: 8.2 mg/dL — ABNORMAL LOW (ref 8.9–10.3)
Chloride: 107 mmol/L (ref 98–111)
Creatinine, Ser: 1.7 mg/dL — ABNORMAL HIGH (ref 0.61–1.24)
GFR, Estimated: 54 mL/min — ABNORMAL LOW (ref >60)
Glucose, Bld: 92 mg/dL (ref 70–99)
Potassium: 3.5 mmol/L (ref 3.5–5.1)
Sodium: 139 mmol/L (ref 135–145)

## 2022-05-12 LAB — ECHOCARDIOGRAM COMPLETE
Area-P 1/2: 4.17 cm2
Calc EF: 37 %
Height: 66 in
S' Lateral: 3.7 cm
Single Plane A2C EF: 24.4 %
Single Plane A4C EF: 46.9 %
Weight: 3120 oz

## 2022-05-12 LAB — TROPONIN I (HIGH SENSITIVITY)
Troponin I (High Sensitivity): 8 ng/L (ref <18)
Troponin I (High Sensitivity): 8 ng/L (ref <18)

## 2022-05-12 LAB — HIV ANTIBODY (ROUTINE TESTING W REFLEX): HIV Screen 4th Generation wRfx: NONREACTIVE

## 2022-05-12 MED ORDER — CARVEDILOL 12.5 MG PO TABS
12.5000 mg | ORAL_TABLET | Freq: Two times a day (BID) | ORAL | Status: DC
  Administered 2022-05-12 – 2022-05-14 (×5): 12.5 mg via ORAL
  Filled 2022-05-12 (×5): qty 1

## 2022-05-12 MED ORDER — ATORVASTATIN CALCIUM 40 MG PO TABS
40.0000 mg | ORAL_TABLET | Freq: Every day | ORAL | Status: DC
  Administered 2022-05-12 – 2022-05-14 (×3): 40 mg via ORAL
  Filled 2022-05-12 (×3): qty 1

## 2022-05-12 MED ORDER — ISOSORBIDE MONONITRATE ER 30 MG PO TB24
30.0000 mg | ORAL_TABLET | Freq: Every day | ORAL | Status: DC
  Administered 2022-05-12 – 2022-05-14 (×3): 30 mg via ORAL
  Filled 2022-05-12 (×3): qty 1

## 2022-05-12 MED ORDER — PERFLUTREN LIPID MICROSPHERE
1.0000 mL | INTRAVENOUS | Status: AC | PRN
  Administered 2022-05-12: 3 mL via INTRAVENOUS

## 2022-05-12 MED ORDER — EZETIMIBE 10 MG PO TABS
10.0000 mg | ORAL_TABLET | Freq: Every day | ORAL | Status: DC
  Administered 2022-05-12 – 2022-05-14 (×3): 10 mg via ORAL
  Filled 2022-05-12 (×3): qty 1

## 2022-05-12 MED ORDER — AMLODIPINE BESYLATE 10 MG PO TABS
10.0000 mg | ORAL_TABLET | Freq: Every day | ORAL | Status: DC
  Administered 2022-05-12 – 2022-05-14 (×3): 10 mg via ORAL
  Filled 2022-05-12: qty 2
  Filled 2022-05-12 (×2): qty 1

## 2022-05-12 MED ORDER — CLOPIDOGREL BISULFATE 75 MG PO TABS
75.0000 mg | ORAL_TABLET | Freq: Every day | ORAL | Status: DC
  Administered 2022-05-12 – 2022-05-14 (×3): 75 mg via ORAL
  Filled 2022-05-12 (×3): qty 1

## 2022-05-12 NOTE — Plan of Care (Signed)
  Problem: Education: Goal: Understanding of cardiac disease, CV risk reduction, and recovery process will improve Outcome: Progressing   Problem: Cardiac: Goal: Ability to achieve and maintain adequate cardiovascular perfusion will improve Outcome: Progressing   Problem: Health Behavior/Discharge Planning: Goal: Ability to safely manage health-related needs after discharge will improve Outcome: Progressing   Problem: Pain Managment: Goal: General experience of comfort will improve Outcome: Progressing   Problem: Safety: Goal: Ability to remain free from injury will improve Outcome: Progressing
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
## 2022-05-13 ENCOUNTER — Encounter (HOSPITAL_COMMUNITY)
Admission: EM | Disposition: A | Discharge status: Home / Self Care | Payer: Self-pay | Source: Home / Self Care | Attending: Internal Medicine

## 2022-05-13 DIAGNOSIS — I2510 Atherosclerotic heart disease of native coronary artery without angina pectoris: Secondary | ICD-10-CM

## 2022-05-13 HISTORY — PX: CORONARY ANGIOGRAPHY: CATH118303

## 2022-05-13 LAB — CBC
HCT: 39.2 % (ref 39.0–52.0)
Hemoglobin: 13.6 g/dL (ref 13.0–17.0)
MCH: 31.6 pg (ref 26.0–34.0)
MCHC: 34.7 g/dL (ref 30.0–36.0)
MCV: 91.2 fL (ref 80.0–100.0)
Platelets: 249 10*3/uL (ref 150–400)
RBC: 4.3 MIL/uL (ref 4.22–5.81)
RDW: 12.2 % (ref 11.5–15.5)
WBC: 6.1 10*3/uL (ref 4.0–10.5)
nRBC: 0 % (ref 0.0–0.2)

## 2022-05-13 LAB — BASIC METABOLIC PANEL WITH GFR
Anion gap: 5 (ref 5–15)
BUN: 9 mg/dL (ref 6–20)
CO2: 24 mmol/L (ref 22–32)
Calcium: 8.6 mg/dL — ABNORMAL LOW (ref 8.9–10.3)
Chloride: 108 mmol/L (ref 98–111)
Creatinine, Ser: 1.41 mg/dL — ABNORMAL HIGH (ref 0.61–1.24)
GFR, Estimated: >60 mL/min (ref >60)
Glucose, Bld: 105 mg/dL — ABNORMAL HIGH (ref 70–99)
Potassium: 3.5 mmol/L (ref 3.5–5.1)
Sodium: 137 mmol/L (ref 135–145)

## 2022-05-13 LAB — MAGNESIUM: Magnesium: 1.6 mg/dL — ABNORMAL LOW (ref 1.7–2.4)

## 2022-05-13 SURGERY — CORONARY ANGIOGRAPHY (CATH LAB)
Anesthesia: LOCAL

## 2022-05-13 MED ORDER — VERAPAMIL HCL 2.5 MG/ML IV SOLN
INTRAVENOUS | Status: AC
  Filled 2022-05-13: qty 2

## 2022-05-13 MED ORDER — SODIUM CHLORIDE 0.9% FLUSH: 3.0000 mL | INTRAVENOUS | Status: DC | PRN

## 2022-05-13 MED ORDER — POTASSIUM CHLORIDE CRYS ER 20 MEQ PO TBCR
40.0000 meq | EXTENDED_RELEASE_TABLET | Freq: Once | ORAL | Status: AC
  Administered 2022-05-13: 40 meq via ORAL
  Filled 2022-05-13: qty 2

## 2022-05-13 MED ORDER — SODIUM CHLORIDE 0.9% FLUSH: 3.0000 mL | Freq: Two times a day (BID) | INTRAVENOUS | Status: DC

## 2022-05-13 MED ORDER — SODIUM CHLORIDE 0.9 % IV SOLN: INTRAVENOUS | Status: DC

## 2022-05-13 MED ORDER — FENTANYL CITRATE (PF) 100 MCG/2ML IJ SOLN
INTRAMUSCULAR | Status: DC | PRN
  Administered 2022-05-13: 25 ug via INTRAVENOUS

## 2022-05-13 MED ORDER — SODIUM CHLORIDE 0.9 % WEIGHT BASED INFUSION
1.0000 mL/kg/h | INTRAVENOUS | Status: AC
  Administered 2022-05-13: 1 mL/kg/h via INTRAVENOUS

## 2022-05-13 MED ORDER — VERAPAMIL HCL 2.5 MG/ML IV SOLN
INTRAVENOUS | Status: DC | PRN
  Administered 2022-05-13: 10 mL via INTRA_ARTERIAL

## 2022-05-13 MED ORDER — MAGNESIUM SULFATE 2 GM/50ML IV SOLN
2.0000 g | Freq: Once | INTRAVENOUS | Status: AC
  Administered 2022-05-13: 2 g via INTRAVENOUS
  Filled 2022-05-13: qty 50

## 2022-05-13 MED ORDER — SODIUM CHLORIDE 0.9% FLUSH
3.0000 mL | Freq: Two times a day (BID) | INTRAVENOUS | Status: DC
Start: 2022-05-13 — End: 2022-05-14
  Administered 2022-05-13: 3 mL via INTRAVENOUS

## 2022-05-13 MED ORDER — HEPARIN (PORCINE) IN NACL 1000-0.9 UT/500ML-% IV SOLN
INTRAVENOUS | Status: AC
  Filled 2022-05-13: qty 1000

## 2022-05-13 MED ORDER — SODIUM CHLORIDE 0.9 % IV SOLN: 250.0000 mL | INTRAVENOUS | Status: DC | PRN

## 2022-05-13 MED ORDER — HEPARIN SODIUM (PORCINE) 1000 UNIT/ML IJ SOLN
INTRAMUSCULAR | Status: DC | PRN
  Administered 2022-05-13: 4500 [IU] via INTRAVENOUS

## 2022-05-13 MED ORDER — IOHEXOL 350 MG/ML SOLN
INTRAVENOUS | Status: DC | PRN
  Administered 2022-05-13: 70 mL

## 2022-05-13 MED ORDER — FENTANYL CITRATE (PF) 100 MCG/2ML IJ SOLN
INTRAMUSCULAR | Status: AC
  Filled 2022-05-13: qty 2

## 2022-05-13 MED ORDER — LIDOCAINE HCL (PF) 1 % IJ SOLN
INTRAMUSCULAR | Status: AC
  Filled 2022-05-13: qty 30

## 2022-05-13 MED ORDER — MIDAZOLAM HCL 2 MG/2ML IJ SOLN
INTRAMUSCULAR | Status: AC
  Filled 2022-05-13: qty 2

## 2022-05-13 MED ORDER — ASPIRIN 81 MG PO CHEW: 81.0000 mg | CHEWABLE_TABLET | ORAL | Status: DC

## 2022-05-13 MED ORDER — LIDOCAINE HCL (PF) 1 % IJ SOLN
INTRAMUSCULAR | Status: DC | PRN
  Administered 2022-05-13: 2 mL

## 2022-05-13 MED ORDER — MIDAZOLAM HCL 2 MG/2ML IJ SOLN
INTRAMUSCULAR | Status: DC | PRN
  Administered 2022-05-13: 1 mg via INTRAVENOUS

## 2022-05-13 MED ORDER — HEPARIN (PORCINE) IN NACL 1000-0.9 UT/500ML-% IV SOLN
INTRAVENOUS | Status: DC | PRN
  Administered 2022-05-13 (×2): 500 mL

## 2022-05-13 SURGICAL SUPPLY — 10 items
BAND CMPR LRG ZPHR (HEMOSTASIS) ×1
BAND ZEPHYR COMPRESS 30 LONG (HEMOSTASIS) ×1 IMPLANT
CATH 5FR JL3.5 JR4 ANG PIG MP (CATHETERS) ×1 IMPLANT
GLIDESHEATH SLEND SS 6F .021 (SHEATH) ×1 IMPLANT
GUIDEWIRE INQWIRE 1.5J.035X260 (WIRE) IMPLANT
INQWIRE 1.5J .035X260CM (WIRE) ×2
KIT HEART LEFT (KITS) ×3 IMPLANT
PACK CARDIAC CATHETERIZATION (CUSTOM PROCEDURE TRAY) ×3 IMPLANT
TRANSDUCER W/STOPCOCK (MISCELLANEOUS) ×3 IMPLANT
TUBING CIL FLEX 10 FLL-RA (TUBING) ×3 IMPLANT
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
## 2022-05-13 NOTE — Interval H&P Note (Signed)
History and Physical Interval Note:  05/13/2022 2:45 PM  [name]  has presented today for surgery, with the diagnosis of chest pain, known CAD.  The various methods of treatment have been discussed with the patient and family. After consideration of risks, benefits and other options for treatment, the patient has consented to  Procedure(s): LEFT HEART CATH AND CORONARY ANGIOGRAPHY (N/A) as a surgical intervention.  The patient's history has been reviewed, patient examined, no change in status, stable for surgery.  I have reviewed the patient's chart and labs.  Questions were answered to the patient's satisfaction.    Cath Lab Visit (complete for each Cath Lab visit)  Clinical Evaluation Leading to the Procedure:   ACS: Yes.    Non-ACS:    Anginal Classification: CCS II  Anti-ischemic medical therapy: Minimal Therapy (1 class of medications)  Non-Invasive Test Results: No non-invasive testing performed  Prior CABG: No previous CABG       Theron Arista [facility] 05/13/2022 2:45 PM

## 2022-05-13 NOTE — Progress Notes (Signed)
CSW received consult for patient.Patient with patients spouse at bedside request for patient to be screened for medicaid. CSW informed patient with patients spouse that CSW will reach out to financial counseling. CSW emailed Shanda Bumps with financial counseling and request for patient to be screened for medicaid. All questions answered. No further questions reported at this time.

## 2022-05-13 NOTE — Care Management (Signed)
  Transition of Care [facility]) Screening Note   Patient Details  Name: [name] Date of Birth: Sep 07, 1991   Transition of Care [facility]) CM/SW Contact:    Gala Lewandowsky, RN Phone Number: 05/13/2022, 10:07 AM    Transition of Care Department [facility]) has reviewed the patient. Patient is currently without insurance and has a PCP at the [facility] and [employer]. Appointment unable to be made- the nurse at the [facility] will have to call the patient to schedule. Case Manager received a consult for medication assistance. Case Manager will follow for [facility] assistance to see if the patient is a candidate. Case Manager will continue to follow for additional transition of care needs.
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
## 2022-05-14 ENCOUNTER — Telehealth (HOSPITAL_COMMUNITY): Payer: Self-pay | Admitting: Pharmacy Technician

## 2022-05-14 ENCOUNTER — Encounter (HOSPITAL_COMMUNITY): Payer: Self-pay | Admitting: Cardiology

## 2022-05-14 ENCOUNTER — Other Ambulatory Visit: Payer: Self-pay

## 2022-05-14 ENCOUNTER — Other Ambulatory Visit (HOSPITAL_COMMUNITY): Payer: Self-pay

## 2022-05-14 DIAGNOSIS — I513 Intracardiac thrombosis, not elsewhere classified: Secondary | ICD-10-CM

## 2022-05-14 LAB — BASIC METABOLIC PANEL
Anion gap: 12 (ref 5–15)
BUN: 10 mg/dL (ref 6–20)
CO2: 28 mmol/L (ref 22–32)
Calcium: 9.1 mg/dL (ref 8.9–10.3)
Chloride: 100 mmol/L (ref 98–111)
Creatinine, Ser: 1.24 mg/dL (ref 0.61–1.24)
GFR, Estimated: >60 mL/min (ref >60)
Glucose, Bld: 97 mg/dL (ref 70–99)
Potassium: 4 mmol/L (ref 3.5–5.1)
Sodium: 140 mmol/L (ref 135–145)

## 2022-05-14 LAB — MAGNESIUM: Magnesium: 1.7 mg/dL (ref 1.7–2.4)

## 2022-05-14 MED ORDER — SPIRONOLACTONE 25 MG PO TABS
25.0000 mg | ORAL_TABLET | Freq: Every day | ORAL | Status: DC
  Administered 2022-05-14: 25 mg via ORAL
  Filled 2022-05-14: qty 1

## 2022-05-14 MED ORDER — APIXABAN 5 MG PO TABS
5.0000 mg | ORAL_TABLET | Freq: Two times a day (BID) | ORAL | 1 refills | Status: AC
  Filled 2022-05-14: qty 60, 30d supply, fill #0
  Filled 2022-07-01 – 2022-09-27 (×2): qty 60, 30d supply, fill #1
  Filled 2022-09-27: qty 20, 10d supply, fill #1

## 2022-05-14 MED ORDER — APIXABAN 5 MG PO TABS
5.0000 mg | ORAL_TABLET | Freq: Two times a day (BID) | ORAL | Status: DC
  Administered 2022-05-14: 5 mg via ORAL
  Filled 2022-05-14: qty 1

## 2022-05-14 MED ORDER — LOSARTAN POTASSIUM 50 MG PO TABS
50.0000 mg | ORAL_TABLET | Freq: Every day | ORAL | Status: DC
Start: 2022-05-14 — End: 2022-05-14
  Administered 2022-05-14: 50 mg via ORAL
  Filled 2022-05-14: qty 1

## 2022-05-14 MED ORDER — LOSARTAN POTASSIUM 50 MG PO TABS
50.0000 mg | ORAL_TABLET | Freq: Every day | ORAL | 1 refills | Status: AC
Start: 2022-05-14 — End: ?
  Filled 2022-05-14: qty 30, 30d supply, fill #0
  Filled 2022-07-01 (×2): qty 30, 30d supply, fill #1

## 2022-05-14 NOTE — Discharge Instructions (Addendum)
Information on my medicine - ELIQUIS (apixaban)  Why was Eliquis prescribed for you? Eliquis was prescribed for you to reduce the risk of a stroke from a clot in the heart   What do You need to know about Eliquis ? Take your Eliquis TWICE DAILY - one tablet in the morning and one tablet in the evening with or without food. If you have difficulty swallowing the tablet whole please discuss with your pharmacist how to take the medication safely.  Take Eliquis exactly as prescribed by your doctor and DO NOT stop taking Eliquis without talking to the doctor who prescribed the medication.  Stopping may increase your risk of developing a stroke.  Refill your prescription before you run out.  After discharge, you should have regular check-up appointments with your healthcare provider that is prescribing your Eliquis.  In the future your dose may need to be changed if your kidney function or weight changes by a significant amount or as you get older.  What do you do if you miss a dose? If you miss a dose, take it as soon as you remember on the same day and resume taking twice daily.  Do not take more than one dose of ELIQUIS at the same time to make up a missed dose.  Important Safety Information A possible side effect of Eliquis is bleeding. You should call your healthcare provider right away if you experience any of the following: Bleeding from an injury or your nose that does not stop. Unusual colored urine (red or dark brown) or unusual colored stools (red or black). Unusual bruising for unknown reasons. A serious fall or if you hit your head (even if there is no bleeding).  Some medicines may interact with Eliquis and might increase your risk of bleeding or clotting while on Eliquis. To help avoid this, consult your healthcare provider or pharmacist prior to using any new prescription or non-prescription medications, including herbals, vitamins, non-steroidal anti-inflammatory drugs  (NSAIDs) and supplements.  This website has more information on Eliquis (apixaban): http://www.eliquis.com/eliquis/home
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
## 2022-05-14 NOTE — Telephone Encounter (Signed)
Pharmacy Patient Advocate Encounter  Insurance verification completed.    The patient has a [employer]   The patient is currently admitted and ran test claims for the following: Eliquis.  Copays and coinsurance results were relayed to Inpatient clinical team.
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
## 2022-05-15 LAB — LIPOPROTEIN A (LPA): Lipoprotein (a): 187.8 nmol/L — ABNORMAL HIGH (ref <75.0)

## 2022-05-25 ENCOUNTER — Telehealth: Payer: Self-pay | Admitting: Physician Assistant

## 2022-05-25 NOTE — Telephone Encounter (Signed)
Received telephone call from Gerald Stabs, [place], director on [facility]. Pt had returned to unit from which he was discharged from earlier this month inquiring about work note. I had not met patient before so inquired from Dr. Johnsie Cancel who saw patient earlier this month in rounds -> OK to remain out of work until seen in follow-up which is scheduled for 8/4. 6e RN will give work note. Advised that if patient has any FMLA forms he should drop off at [facility] for our department that processes these. Pt will keep f/u as scheduled.

## 2022-05-31 ENCOUNTER — Encounter: Payer: Self-pay | Admitting: Physician Assistant
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
## 2022-06-04 ENCOUNTER — Encounter: Payer: Self-pay | Admitting: Physician Assistant

## 2022-06-04 ENCOUNTER — Telehealth: Payer: Self-pay | Admitting: Licensed Clinical Social Worker

## 2022-06-04 ENCOUNTER — Ambulatory Visit (INDEPENDENT_AMBULATORY_CARE_PROVIDER_SITE_OTHER): Payer: Self-pay | Admitting: Physician Assistant

## 2022-06-04 ENCOUNTER — Telehealth (HOSPITAL_COMMUNITY): Payer: Self-pay | Admitting: Licensed Clinical Social Worker

## 2022-06-04 ENCOUNTER — Other Ambulatory Visit: Payer: Self-pay

## 2022-06-04 VITALS — BP 120/80 | HR 70 | Ht 66.0 in | Wt 193.2 lb

## 2022-06-04 DIAGNOSIS — I5022 Chronic systolic (congestive) heart failure: Secondary | ICD-10-CM

## 2022-06-04 DIAGNOSIS — I513 Intracardiac thrombosis, not elsewhere classified: Secondary | ICD-10-CM

## 2022-06-04 DIAGNOSIS — E785 Hyperlipidemia, unspecified: Secondary | ICD-10-CM

## 2022-06-04 DIAGNOSIS — I10 Essential (primary) hypertension: Secondary | ICD-10-CM

## 2022-06-04 DIAGNOSIS — I2510 Atherosclerotic heart disease of native coronary artery without angina pectoris: Secondary | ICD-10-CM

## 2022-06-04 DIAGNOSIS — G4730 Sleep apnea, unspecified: Secondary | ICD-10-CM

## 2022-06-04 NOTE — Telephone Encounter (Signed)
CSW received message from provider today requesting assistance with obtaining insurance coverage as patient has no insurance. CSW contacted patient and spoke with wife who will have patient return call when available. Lasandra Beech, LCSW, CCSW-MCS [phone]

## 2022-06-04 NOTE — Patient Instructions (Addendum)
Medication Instructions:  STOP Ibuprofen  *If you need a refill on your cardiac medications before your next appointment, please call your pharmacy*   Lab Work: TODAY-BMET, CBC If you have labs (blood work) drawn today and your tests are completely normal, you will receive your results only by: MyChart Message (if you have MyChart) OR A paper copy in the mail If you have any lab test that is abnormal or we need to change your treatment, we will call you to review the results.   Testing/Procedures: Your physician has requested that you have an echocardiogram. Echocardiography is a painless test that uses sound waves to create images of your heart. It provides your doctor with information about the size and shape of your heart and how well your heart's chambers and valves are working. This procedure takes approximately one hour. There are no restrictions for this procedure. SCHEDULE ECHO FOR AFTER 08/14/2022.   Follow-Up: At [facility], you and your health needs are our priority.  As part of our continuing mission to provide you with exceptional heart care, we have created designated Provider Care Teams.  These Care Teams include your primary Cardiologist (physician) and Advanced Practice Providers (APPs -  Physician Assistants and Nurse Practitioners) who all work together to provide you with the care you need, when you need it.  We recommend signing up for the patient portal called "MyChart".  Sign up information is provided on this After Visit Summary.  MyChart is used to connect with patients for Virtual Visits (Telemedicine).  Patients are able to view lab/test results, encounter notes, upcoming appointments, etc.  Non-urgent messages can be sent to your provider as well.   To learn more about what you can do with MyChart, go to [email or web address].    Your next appointment:   3 month(s)  The format for your next appointment:   In Person  Provider:   Meriam Sprague, MD  (SCHEDULE AFTER ECHO)    Other Instructions:  Patients taking blood thinners should generally stay away from medicines like ibuprofen, Advil, Motrin, naproxen, and Aleve due to risk of stomach bleeding. You may take Tylenol as directed or talk to your primary doctor about alternatives. It is also important to limit alcohol.   For patients with congestive heart failure, we give them these special instructions:  1. Follow a low-salt diet - you should be eating no more than 2,000mg  of sodium per day. This does not necessarily just apply to the salt you put on top of prepared food, but the sodium already in food. Processed food, frozen meals, canned goods, deli meat, and bread can have a surprising amount of sodium per serving so be sure to track this daily. 2. Watch your fluid intake. In general, you should not be taking in more than 2 liters of fluid per day (close to 64 oz of fluid per day). This includes sources of water in foods like soup, coffee, tea, milk, etc. It's important to stay hydrated but NOT to excess. 2. Weigh yourself on the same scale at same time of day and keep a log. 3. Call your doctor: (Anytime you feel any of the following symptoms)  - 3lb weight gain overnight or 5lb within a few days - Shortness of breath, with or without a dry hacking cough  - Swelling in the hands, feet or stomach  - If you have to sleep on extra pillows at night in order to breathe   IT IS IMPORTANT TO LET YOUR  DOCTOR KNOW EARLY ON IF YOU ARE HAVING SYMPTOMS SO WE CAN HELP YOU!   Important Information About Sugar

## 2022-06-04 NOTE — Progress Notes (Signed)
Heart and Vascular Care Navigation  06/04/2022  [name] [birth year] [id number]  Reason for Referral: CSW referred to assist with insurance options.   Engaged with patient by telephone for initial visit for Heart and Vascular Care Coordination.                                                                                                   Assessment:  Patient is a 31yo male who works full time. He reports that he is currently homeless and staying with a friend. He states that he has a 78 yo son who lives with mother. Patient has no insurance and states he has followed up with [facility] for PCP.   He was given the [employer] application but states he lost it.                               HRT/VAS Care Coordination     Patients Home Cardiology Office [facility]   Outpatient Care Team Social Worker   Social Worker Name: Lasandra Beech, [place] [phone]   Living arrangements for the past 2 months Homeless  Staying with a friend   Lives with: Friends   Patient Current Insurance Coverage Self-Pay   Patient Has Concern With Paying Medical Bills Yes   Does Patient Have Prescription Coverage? No   Home Assistive Devices/Equipment None   DME Agency NA   HH Agency NA       Social History:                                                                             SDOH Screenings   Alcohol Screen: Not on file  Depression (PHQ2-9): Medium Risk (04/02/2022)   Depression (PHQ2-9)    PHQ-2 Score: 6  Financial Resource Strain: Medium Risk (06/04/2022)   Overall Financial Resource Strain (CARDIA)    Difficulty of Paying Living Expenses: Somewhat hard  Food Insecurity: No Food Insecurity (06/04/2022)   Hunger Vital Sign    Worried About Running Out of Food in the Last Year: Never true    Ran Out of Food in the Last Year: Never true  Housing: High Risk (06/04/2022)   Housing    Last Housing Risk Score: 4  Physical Activity: Not on file  Social Connections: Not on  file  Stress: Not on file  Tobacco Use: Medium Risk (06/04/2022)   Patient History    Smoking Tobacco Use: Former    Smokeless Tobacco Use: Never    Passive Exposure: Not on file  Transportation Needs: No Transportation Needs (06/04/2022)   PRAPARE - Administrator, Civil Service (Medical): No    Lack of Transportation (Non-Medical): No    SDOH Interventions: Financial Resources:  Surveyor, quantity  Strain Interventions: Intervention Not Indicated Financial Counseling for [employer] Program  Food Insecurity:  Food Insecurity Interventions:  (Patient has food stamps)  Housing Insecurity:  Housing Interventions: Other (Comment)  Transportation:       Follow-up plan: CSW reviewed needed documents for CAFA and patient states he will gather and come to appointment on Monday August 7 at 9 am to complete application with CSW.  Patient grateful for the assistance. Lasandra Beech, LCSW, CCSW-MCS [phone]

## 2022-06-05 ENCOUNTER — Ambulatory Visit (HOSPITAL_COMMUNITY)
Admission: EM | Admit: 2022-06-05 | Discharge: 2022-06-05 | Disposition: A | Discharge status: Home / Self Care | Payer: Self-pay | Attending: Family Medicine | Admitting: Family Medicine

## 2022-06-05 ENCOUNTER — Encounter (HOSPITAL_COMMUNITY): Payer: Self-pay | Admitting: *Deleted

## 2022-06-05 ENCOUNTER — Other Ambulatory Visit: Payer: Self-pay

## 2022-06-05 DIAGNOSIS — K047 Periapical abscess without sinus: Secondary | ICD-10-CM

## 2022-06-05 LAB — CBC
Hematocrit: 39.3 % (ref 37.5–51.0)
Hemoglobin: 13.9 g/dL (ref 13.0–17.7)
MCH: 31.6 pg (ref 26.6–33.0)
MCHC: 35.4 g/dL (ref 31.5–35.7)
MCV: 89 fL (ref 79–97)
Platelets: 339 10*3/uL (ref 150–450)
RBC: 4.4 x10E6/uL (ref 4.14–5.80)
RDW: 13.1 % (ref 11.6–15.4)
WBC: 5.9 10*3/uL (ref 3.4–10.8)

## 2022-06-05 LAB — BASIC METABOLIC PANEL
BUN/Creatinine Ratio: 12 (ref 9–20)
BUN: 12 mg/dL (ref 6–20)
CO2: 22 mmol/L (ref 20–29)
Calcium: 9.4 mg/dL (ref 8.7–10.2)
Chloride: 101 mmol/L (ref 96–106)
Creatinine, Ser: 1 mg/dL (ref 0.76–1.27)
Glucose: 77 mg/dL (ref 70–99)
Potassium: 3.9 mmol/L (ref 3.5–5.2)
Sodium: 138 mmol/L (ref 134–144)
eGFR: 103 mL/min/{1.73_m2} (ref >59)

## 2022-06-05 MED ORDER — AMOXICILLIN-POT CLAVULANATE 875-125 MG PO TABS: 1.0000 | ORAL_TABLET | Freq: Two times a day (BID) | ORAL | 0 refills | Status: AC

## 2022-06-05 MED ORDER — TRAMADOL HCL 50 MG PO TABS
50.0000 mg | ORAL_TABLET | Freq: Four times a day (QID) | ORAL | 0 refills | Status: AC | PRN
Start: 2022-06-05 — End: ?
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
## 2022-06-05 NOTE — Discharge Instructions (Addendum)
Take amoxicillin-clavulanate 875 mg--1 tab twice daily with food for 7 days  Take tramadol 50 mg-- 1 tablet every 6 hours as needed for pain.  This medication can make you sleepy or dizzy   

## 2022-06-05 NOTE — ED Triage Notes (Signed)
Pt presents with Lt sided facial swelling and dental pain for 3 days.

## 2022-06-07 ENCOUNTER — Telehealth: Payer: Self-pay | Admitting: Licensed Clinical Social Worker

## 2022-06-07 NOTE — Telephone Encounter (Signed)
Patient did not show for our meeting this morning to complete the [facility] application. CSW contacted patient and left message with wife to return call. Lasandra Beech, LCSW, CCSW-MCS [phone]

## 2022-06-14 ENCOUNTER — Ambulatory Visit: Payer: Self-pay | Admitting: Family Medicine

## 2022-06-15 ENCOUNTER — Telehealth (HOSPITAL_COMMUNITY): Payer: Self-pay | Admitting: Licensed Clinical Social Worker

## 2022-06-15 ENCOUNTER — Telehealth: Payer: Self-pay

## 2022-06-15 ENCOUNTER — Other Ambulatory Visit: Payer: Self-pay

## 2022-06-15 DIAGNOSIS — Z79899 Other long term (current) drug therapy: Secondary | ICD-10-CM

## 2022-06-15 DIAGNOSIS — I10 Essential (primary) hypertension: Secondary | ICD-10-CM

## 2022-06-15 MED ORDER — SACUBITRIL-VALSARTAN 49-51 MG PO TABS
1.0000 | ORAL_TABLET | Freq: Two times a day (BID) | ORAL | 2 refills | Status: AC
  Filled 2022-06-15 – 2022-09-27 (×4): qty 60, 30d supply, fill #0

## 2022-06-15 NOTE — Telephone Encounter (Signed)
CSW contacted patient to reschedule meeting from last week to assist with [facility]. Patient will meet with CSW on Monday June 21, 2022 at 11 am. Patient grateful for the support and will meet CSW at that time. Lasandra Beech, LCSW, CCSW-MCS [phone]

## 2022-06-15 NOTE — Telephone Encounter (Signed)
Received a call from the patient today. He states he got my message and was calling back to go over lab results.   Gave the patient Dayna's recommendations. He voiced understanding and requested to have Annice Pih, our social worker reach out to him he states he missed the appointment.   Reviewed instructions throughly and clearly.   Pateint requested to have these instructions mailed to him. Advised the patient to access his mychart to review instructions faster. He states he is locked out of his account. I advised the patient to contact the help line for them to unlock his account. Released the inms Reviewed instructions with the patient once more to make sure he voiced understanding.

## 2022-06-17 ENCOUNTER — Other Ambulatory Visit: Payer: Self-pay

## 2022-06-21 ENCOUNTER — Telehealth (HOSPITAL_COMMUNITY): Payer: Self-pay | Admitting: Licensed Clinical Social Worker

## 2022-06-21 NOTE — Telephone Encounter (Signed)
Patient did not show today to meet with CSW for assistance with Patient Assistance and [facility] Financial Assistance. CSW available if patient should return to office. Lasandra Beech, LCSW, CCSW-MCS [phone]

## 2022-06-22 ENCOUNTER — Other Ambulatory Visit: Payer: Self-pay

## 2022-07-01 ENCOUNTER — Other Ambulatory Visit: Payer: Self-pay

## 2022-07-01 ENCOUNTER — Other Ambulatory Visit (HOSPITAL_COMMUNITY): Payer: Self-pay

## 2022-07-07 ENCOUNTER — Other Ambulatory Visit: Payer: Self-pay

## 2022-07-08 ENCOUNTER — Other Ambulatory Visit: Payer: Self-pay

## 2022-07-20 ENCOUNTER — Ambulatory Visit: Payer: Self-pay

## 2022-08-17 ENCOUNTER — Encounter (HOSPITAL_COMMUNITY): Payer: Self-pay | Admitting: Physician Assistant

## 2022-08-17 ENCOUNTER — Other Ambulatory Visit (HOSPITAL_COMMUNITY): Payer: Self-pay
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
## 2022-08-23 ENCOUNTER — Ambulatory Visit: Payer: Self-pay | Attending: Cardiology | Admitting: Cardiology

## 2022-09-27 ENCOUNTER — Other Ambulatory Visit: Payer: Self-pay

## 2022-09-28 ENCOUNTER — Other Ambulatory Visit: Payer: Self-pay

## 2022-10-04 ENCOUNTER — Ambulatory Visit: Payer: Self-pay | Admitting: Internal Medicine

## 2023-07-08 NOTE — Progress Notes (Unsigned)
This encounter was created in error - please disregard.

## 2023-08-01 ENCOUNTER — Encounter: Payer: Self-pay | Admitting: Family Medicine

## 2023-08-01 ENCOUNTER — Ambulatory Visit: Payer: Self-pay | Admitting: Family Medicine

## 2023-08-01 DIAGNOSIS — N341 Nonspecific urethritis: Secondary | ICD-10-CM

## 2023-08-01 DIAGNOSIS — Z113 Encounter for screening for infections with a predominantly sexual mode of transmission: Secondary | ICD-10-CM

## 2023-08-01 LAB — HEPATITIS B SURFACE ANTIGEN: Hepatitis B Surface Ag: NONREACTIVE

## 2023-08-01 LAB — GRAM STAIN

## 2023-08-01 LAB — HM HIV SCREENING LAB: HM HIV Screening: NEGATIVE

## 2023-08-01 LAB — HM HEPATITIS C SCREENING LAB: HM Hepatitis Screen: NEGATIVE

## 2023-08-01 MED ORDER — DOXYCYCLINE HYCLATE 100 MG PO TABS: 100.0000 mg | ORAL_TABLET | Freq: Two times a day (BID) | ORAL | Status: AC

## 2023-08-01 NOTE — Progress Notes (Signed)
[facility] Department STI clinic/screening visit  Subjective:  [name] is a 32 y.o. male being seen today for an STI screening visit. The patient reports they do have symptoms.    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 Discharge is yellow, thin, no odor. Patient states he has had similar discharge a long time ago (years and years) that he never had tested or treated.He reports 2 male partners in the last 2 months. No male partners. Condoms sometimes. No oral or anal sex. Wishes to have STI testing today.    Last HIV test per patient/review of record was No results found for: "HMHIVSCREEN"  Lab Results  Component Value Date   HIV Non Reactive 05/12/2022    Does the patient or their partner desires a pregnancy in the next year? No  Screening for MPX risk: Does the patient have an unexplained rash? No Is the patient MSM? No Does the patient endorse multiple sex partners or anonymous sex partners? Yes Did the patient have close or sexual contact with a person diagnosed with MPX? No Has the patient traveled outside the [place] where MPX is endemic? No Is there a high clinical suspicion for MPX-- evidenced by one of the following No  -Unlikely to be chickenpox  -Lymphadenopathy  -Rash that present in same phase of evolution on any given body part   See flowsheet for further details and  programmatic requirements.   Immunization History  Administered Date(s) Administered   Tdap 01/29/2018     The following portions of the patient's history were reviewed and updated as appropriate: allergies, current medications, past medical history, past social history, past surgical history and problem list.  Objective:  There were no vitals filed for this visit.  Physical Exam Nursing note reviewed. Exam conducted with a chaperone present Lenice Llamas, [place] chaperone present in room for PE).  Constitutional:      Appearance: Normal appearance.  HENT:     Head: Normocephalic.     Salivary Glands: Right salivary gland is not diffusely enlarged or tender. Left salivary gland is not diffusely enlarged or tender.     Comments: No nits or hair loss    Mouth/Throat:     Lips: Pink.     Mouth: Mucous membranes are moist. No oral lesions.     Dentition: Abnormal dentition.     Tongue: No lesions. Tongue does not deviate from midline.     Pharynx: Oropharynx is clear. Uvula midline. No oropharyngeal exudate or posterior oropharyngeal erythema.     Tonsils: No tonsillar exudate.  Eyes:     General:        Right eye: No discharge.        Left eye: No discharge.     Conjunctiva/sclera:     Right eye: Right conjunctiva is not injected. No exudate.    Left eye: Left  conjunctiva is not injected. No exudate. Pulmonary:     Effort: Pulmonary effort is normal.  Genitourinary:    Pubic Area: No rash or pubic lice (no nits).      Penis: Normal. No tenderness, discharge, swelling or lesions.      Testes: Normal.     Epididymis:     Right: Normal. No mass or tenderness.     Left: Normal. No mass or tenderness.     Rectum: Tenderness: no lesions or discharge.       Comments: Penile Discharge Amount: minimal Color:  clear   Lymphadenopathy:     Head:     Right side of head: No submental, submandibular, tonsillar, preauricular or posterior auricular adenopathy.     Left side of  head: No submental, submandibular, tonsillar, preauricular or posterior auricular adenopathy.     Cervical: No cervical adenopathy.     Right cervical: No superficial or posterior cervical adenopathy.    Left cervical: No superficial or posterior cervical adenopathy.     Upper Body:     Right upper body: No supraclavicular, axillary or epitrochlear adenopathy.     Left upper body: No supraclavicular, axillary or epitrochlear adenopathy.     Lower Body: Right inguinal adenopathy present. No left inguinal adenopathy.  Skin:    General: Skin is warm and dry.     Findings: No lesion or rash.     Comments: Appropriate for ethnicity.   Neurological:     Mental Status: He is alert and oriented to person, place, and time.  Psychiatric:        Attention and Perception: Attention normal.        Mood and Affect: Mood normal.        Speech: Speech normal.        Behavior: Behavior normal. Behavior is cooperative.       Assessment and Plan:  [name] is a 32 y.o. male presenting to the [facility] Department for STI screening  1. Screening for venereal disease  - Chlamydia/GC NAA, Confirmation - Gram stain - HIV/HCV [place] [facility] - Syphilis Serology, [place] [facility] - HBV Antigen/Antibody State Lab  2. NGU (nongonococcal urethritis)  - doxycycline (VIBRA-TABS) 100 MG tablet; Take 1 tablet (100 mg total) by mouth 2 (two) times daily for 7 days.   Patient does have STI symptoms Patient accepted all screenings including  urine GC/Chlamydia, and blood work for HIV/Syphilis. Patient meets criteria for HepB screening? Yes. Ordered? yes Patient meets criteria for HepC screening? Yes. Ordered? yes Recommended condom use with all sex Discussed importance of condom use for STI prevent  Treat positive test results per standing order. Discussed time line for State Lab results and that patient will be called with positive results and encouraged patient to call if he had not heard  in 2 weeks Recommended repeat testing in 3 months with positive results. Recommended returning for continued or worsening symptoms.   Return if symptoms worsen or fail to improve.  No future appointments.  Total time with patient 30 minutes.   Edmonia James, NP

## 2023-08-01 NOTE — Progress Notes (Signed)

## 2023-08-01 NOTE — Progress Notes (Addendum)
PT is here for std screening.  Condoms and 2 contact cards given. The patient was dispensed doxycycline today. I provided counseling today regarding the medication. We discussed the medication, the side effects and when to call clinic. Patient given the opportunity to ask questions. Questions answered.  Gaspar Garbe, RN

## 2023-08-05 LAB — CHLAMYDIA/GC NAA, CONFIRMATION
Chlamydia trachomatis, NAA: POSITIVE — AB
Neisseria gonorrhoeae, NAA: NEGATIVE

## 2023-08-05 LAB — C. TRACHOMATIS NAA, CONFIRM: C. trachomatis NAA, Confirm: POSITIVE — AB

## 2023-08-08 ENCOUNTER — Telehealth: Payer: Self-pay

## 2023-08-08 NOTE — Telephone Encounter (Signed)
Pt notified of positive STD result.  Berdie Ogren, RN

## 2023-09-13 ENCOUNTER — Other Ambulatory Visit: Payer: Self-pay

## 2024-01-12 ENCOUNTER — Ambulatory Visit: Payer: Self-pay

## 2024-01-19 ENCOUNTER — Encounter: Payer: Self-pay | Admitting: Nurse Practitioner

## 2024-01-19 ENCOUNTER — Ambulatory Visit: Payer: Self-pay | Admitting: Nurse Practitioner

## 2024-01-19 DIAGNOSIS — Z113 Encounter for screening for infections with a predominantly sexual mode of transmission: Secondary | ICD-10-CM

## 2024-01-19 LAB — HM HEPATITIS C SCREENING LAB: HM Hepatitis Screen: NEGATIVE

## 2024-01-19 LAB — HEPATITIS B SURFACE ANTIGEN

## 2024-01-19 LAB — HM HIV SCREENING LAB: HM HIV Screening: NEGATIVE

## 2024-01-19 NOTE — Progress Notes (Signed)
 Pt is here for STD screening. Condoms given. Sonda Primes, RN.

## 2024-01-19 NOTE — Progress Notes (Signed)
 [facility] Department STI clinic [address] [place] [address] Main phone: [phone]  STI screening visit  Subjective:  [name] is a 33 y.o. male being seen today for an STI screening visit. The patient reports they do not have symptoms.    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 male who presents to the office today requesting asymptomatic STI testing. He reports 1 male partner in the last 2 months. He reports no sex in the last couple of months, but when having sex he practices penile/vaginal penetrative sex. Patient reports using condoms always. Patient indicates a history of Chlamydia in September 2024 (6 months ago).    STI screening history: Last HIV test per patient/review of record was  Lab Results  Component Value Date   HMHIVSCREEN Negative - Validated 08/01/2023    Lab Results  Component Value Date   HIV Non Reactive 05/12/2022    Last HEPC test per patient/review of record was  Lab Results  Component Value Date   HMHEPCSCREEN Negative-Validated 08/01/2023    Last HEPB test per patient/review of record was No components found for: "HMHEPBSCREEN"   Fertility: Does the patient or their partner desires a pregnancy in the next year? No  Screening for MPX risk: Does the patient have an unexplained rash? No Is the patient MSM? No Does the patient endorse multiple sex partners or anonymous sex partners? No Did the patient have  close or sexual contact with a person diagnosed with MPX? No Has the patient traveled outside the [place] where MPX is endemic? No Is there a high clinical suspicion for MPX-- evidenced by one of the following No  -Unlikely to be chickenpox  -Lymphadenopathy  -Rash that present in same phase of evolution on any given body part   See flowsheet for further details and programmatic requirements.   Immunization History  Administered Date(s) Administered   Tdap 01/29/2018     The following portions of the patient's history were reviewed and updated as appropriate: allergies, current medications, past medical history, past social history, past surgical history and problem list.  Objective:  There were no vitals filed for this visit.  Physical Exam Nursing note reviewed. Exam conducted with a chaperone present Curtis Shiner, CNA present in room as chaperone.).  Constitutional:      Appearance: Normal appearance.  HENT:     Head: Normocephalic.     Salivary Glands: Right salivary gland is not diffusely enlarged or tender. Left salivary gland is not diffusely enlarged or tender.     Mouth/Throat:     Lips: Pink. No lesions.     Mouth: Mucous membranes are moist. Oral lesions present.     Dentition: Abnormal dentition.     Tongue: No lesions. Tongue does not deviate from midline.     Pharynx: Oropharynx is clear. Uvula midline. No oropharyngeal exudate or posterior oropharyngeal erythema.     Tonsils: No tonsillar exudate.      Comments: On PE oral  lesions present (see diagram above). (1 each) bilaterally to back inner check near last bottom tooth. Center white with surrounding erythematous border. Too small too measure. Pt states present 2 days.   Eyes:     General:        Right eye: No discharge.        Left eye: No discharge.     Conjunctiva/sclera:     Right eye: Right conjunctiva is not injected. No exudate.    Left eye: Left conjunctiva is not injected. No exudate. Pulmonary:     Effort:  Pulmonary effort is normal.  Chest:       Comments: Healed incisional wound.  Abdominal:       Comments: Healed incisional wound.  Genitourinary:    Pubic Area: No rash or pubic lice.      Penis: Normal. No tenderness, discharge, swelling or lesions.      Testes: Normal.     Epididymis:     Right: Normal. No mass or tenderness.     Left: Normal. No mass or tenderness.     Tanner stage (genital): 5.  Lymphadenopathy:     Head:     Right side of head: No submental, submandibular, tonsillar, preauricular or posterior auricular adenopathy.     Left side of head: No submental, submandibular, tonsillar, preauricular or posterior auricular adenopathy.     Cervical: No cervical adenopathy.     Right cervical: No superficial or posterior cervical adenopathy.    Left cervical: No superficial or posterior cervical adenopathy.     Upper Body:     Right upper body: No supraclavicular or axillary adenopathy.     Left upper body: No supraclavicular or axillary adenopathy.     Lower Body: Right inguinal adenopathy present. Left inguinal adenopathy present.  Skin:    General: Skin is warm and dry.     Findings: No lesion or rash.     Comments: Skin tone appropriate for ethnicity.   Neurological:     Mental Status: He is alert and oriented to person, place, and time.  Psychiatric:        Attention and Perception: Attention and perception normal.        Mood and Affect: Mood and affect normal.        Speech: Speech normal.        Behavior: Behavior normal. Behavior is cooperative.        Thought Content: Thought content normal.     Assessment and Plan:  [name] is a 33 y.o. male presenting to the [facility] Department for STI screening  1. Screening for venereal disease (Primary) Obtaining oral HSV swab in office today d/t patient reported concern of oral lesion. - Gonococcus culture - HBV Antigen/Antibody State Lab - HIV/HCV [place] [facility] - Syphilis Serology, [place]  State Lab - Chlamydia/GC NAA, Confirmation - Virology,  Lab   Patient does not have STI symptoms Patient accepted all screenings including  urine GC/Chlamydia, and blood work for HIV/Syphilis. Patient meets criteria for HepB screening? Yes. Ordered? yes Patient meets criteria for HepC screening? Yes. Ordered? yes Recommended condom use with all sex Discussed importance of condom use for STI prevention  Treat positive test results per standing order. Discussed time line for State Lab results and that patient will be called with positive results and encouraged patient to call if he had not heard in 2 weeks Recommended repeat testing in 3 months with positive results. Recommended returning for continued  or worsening symptoms.   Return if symptoms worsen or fail to improve.  No future appointments. Total time with patient 30 minutes.  Edmonia James, NP

## 2024-01-23 LAB — GONOCOCCUS CULTURE

## 2024-01-23 LAB — CHLAMYDIA/GC NAA, CONFIRMATION
Chlamydia trachomatis, NAA: NEGATIVE
Neisseria gonorrhoeae, NAA: NEGATIVE

## 2024-01-27 ENCOUNTER — Telehealth: Payer: Self-pay

## 2024-01-27 NOTE — Telephone Encounter (Signed)
 Call pt re specimen for Hep B testing. Specimen from 01/19/24 is too old for state lab to test. Offer lab appt to pt for recollection to test for Hep B.

## 2024-01-27 NOTE — Telephone Encounter (Signed)
 Phone call to pt at [phone]. Left message requesting call back about specimen not able to test and offering help with scheduling a lab appt.

## 2024-01-30 NOTE — Telephone Encounter (Signed)
 Phone call to pt. Pt answered and confirmed identity. Pt counseled that we were actually able to use specimens collected during the initial visit to do Hep B testing. Pt counseled that we typically do not call with negative TR; if there is an issue or positive TR we do initiate a call. Pt expressed understanding. No questions.

## 2024-07-19 ENCOUNTER — Ambulatory Visit: Payer: Self-pay

## 2024-07-24 ENCOUNTER — Ambulatory Visit: Payer: Self-pay | Admitting: Family Medicine

## 2024-07-24 DIAGNOSIS — Z113 Encounter for screening for infections with a predominantly sexual mode of transmission: Secondary | ICD-10-CM

## 2024-07-24 DIAGNOSIS — A549 Gonococcal infection, unspecified: Secondary | ICD-10-CM

## 2024-07-24 DIAGNOSIS — F419 Anxiety disorder, unspecified: Secondary | ICD-10-CM | POA: Insufficient documentation

## 2024-07-24 LAB — GRAM STAIN

## 2024-07-24 LAB — HM HIV SCREENING LAB: HM HIV Screening: NEGATIVE

## 2024-07-24 MED ORDER — CEFTRIAXONE SODIUM 500 MG IJ SOLR
500.0000 mg | Freq: Once | INTRAMUSCULAR | Status: AC
  Administered 2024-07-24: 500 mg via INTRAMUSCULAR

## 2024-07-24 NOTE — Patient Instructions (Signed)
 STI screening - Today we obtained a urine sample to screen for gonorrhea and chlamydia - We also obtained a blood sample to screen for HIV and syphilis - If the results are abnormal, I will give you a call.    Estimated time frame for results collected at the [facility] Department: Same day Trichomonas Yeast BV (bacterial vaginosis)  Within 1-2 weeks Gonorrhea Chlamydia  Within 1-2 weeks HIV Syphilis Hepatitis B Hepatitis C

## 2024-07-24 NOTE — Progress Notes (Signed)
 Pt is here for STD screening. Gram stain(+) and WBC>2hpf. Per SO patient was given Ceftriaxone  500 mg IM injection at the [facility]  and patient tolerated well to injection. Patient given the opportunity to ask questions for any clarifications. Questions answered. Condoms, Brochure and contact card given. Wilkie Drought, RN
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
## 2024-07-30 LAB — CHLAMYDIA/GC NAA, CONFIRMATION
Chlamydia trachomatis, NAA: NEGATIVE
Neisseria gonorrhoeae, NAA: POSITIVE — AB

## 2024-07-30 LAB — N. GONORRHOEAE NAA, CONFIRM: N. gonorrhoeae NAA, Confirm: POSITIVE — AB

## 2024-07-31 ENCOUNTER — Ambulatory Visit: Payer: Self-pay

## 2024-07-31 DIAGNOSIS — A549 Gonococcal infection, unspecified: Secondary | ICD-10-CM | POA: Insufficient documentation

## 2024-08-02 ENCOUNTER — Ambulatory Visit: Payer: Self-pay | Admitting: Licensed Clinical Social Worker
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
